# Patient Record
Sex: Female | Born: 1965 | Race: Black or African American | Hispanic: No | Marital: Married | Smoking: Never smoker
Health system: Southern US, Community
[De-identification: ages and names within clinical notes are randomized; demographics above are authoritative.]

## PROBLEM LIST (undated history)

## (undated) DIAGNOSIS — I10 Essential (primary) hypertension: Secondary | ICD-10-CM

## (undated) DIAGNOSIS — H01123 Discoid lupus erythematosus of right eye, unspecified eyelid: Secondary | ICD-10-CM

## (undated) DIAGNOSIS — G629 Polyneuropathy, unspecified: Secondary | ICD-10-CM

## (undated) DIAGNOSIS — C569 Malignant neoplasm of unspecified ovary: Secondary | ICD-10-CM

## (undated) HISTORY — PX: BREAST SURGERY: SHX581

---

## 2008-09-15 ENCOUNTER — Emergency Department (HOSPITAL_BASED_OUTPATIENT_CLINIC_OR_DEPARTMENT_OTHER): Admission: EM | Admit: 2008-09-15 | Discharge: 2008-09-15 | Payer: Self-pay | Admitting: Emergency Medicine

## 2009-01-25 ENCOUNTER — Emergency Department (HOSPITAL_BASED_OUTPATIENT_CLINIC_OR_DEPARTMENT_OTHER): Admission: EM | Admit: 2009-01-25 | Discharge: 2009-01-25 | Payer: Self-pay | Admitting: Emergency Medicine

## 2009-04-16 ENCOUNTER — Emergency Department (HOSPITAL_BASED_OUTPATIENT_CLINIC_OR_DEPARTMENT_OTHER): Admission: EM | Admit: 2009-04-16 | Discharge: 2009-04-16 | Payer: Self-pay | Admitting: Emergency Medicine

## 2010-07-05 LAB — DIFFERENTIAL
Basophils Relative: 2 % — ABNORMAL HIGH (ref 0–1)
Eosinophils Absolute: 0.2 10*3/uL (ref 0.0–0.7)
Lymphs Abs: 2.9 10*3/uL (ref 0.7–4.0)
Monocytes Relative: 6 % (ref 3–12)
Neutro Abs: 4.6 10*3/uL (ref 1.7–7.7)
Neutrophils Relative %: 54 % (ref 43–77)

## 2010-07-05 LAB — URINE CULTURE

## 2010-07-05 LAB — URINALYSIS, ROUTINE W REFLEX MICROSCOPIC
Glucose, UA: >1000 mg/dL — AB
Ketones, ur: NEGATIVE mg/dL
Leukocytes, UA: NEGATIVE
Nitrite: NEGATIVE
Protein, ur: NEGATIVE mg/dL
pH: 6.5 (ref 5.0–8.0)

## 2010-07-05 LAB — GLUCOSE, CAPILLARY
Glucose-Capillary: 304 mg/dL — ABNORMAL HIGH (ref 70–99)
Glucose-Capillary: 440 mg/dL — ABNORMAL HIGH (ref 70–99)

## 2010-07-05 LAB — URINE MICROSCOPIC-ADD ON

## 2010-07-05 LAB — BASIC METABOLIC PANEL
BUN: 12 mg/dL (ref 6–23)
Calcium: 9.2 mg/dL (ref 8.4–10.5)
Creatinine, Ser: 0.7 mg/dL (ref 0.4–1.2)
GFR calc Af Amer: >60 mL/min (ref >60)

## 2010-07-05 LAB — CBC
Platelets: 415 10*3/uL — ABNORMAL HIGH (ref 150–400)
RBC: 4.32 MIL/uL (ref 3.87–5.11)
WBC: 8.3 10*3/uL (ref 4.0–10.5)

## 2010-07-05 LAB — PREGNANCY, URINE: Preg Test, Ur: NEGATIVE

## 2010-07-09 LAB — URINALYSIS, ROUTINE W REFLEX MICROSCOPIC
Hgb urine dipstick: NEGATIVE
Nitrite: NEGATIVE
Protein, ur: NEGATIVE mg/dL
Urobilinogen, UA: 0.2 mg/dL (ref 0.0–1.0)

## 2010-07-09 LAB — DIFFERENTIAL
Basophils Absolute: 0.1 10*3/uL (ref 0.0–0.1)
Basophils Relative: 2 % — ABNORMAL HIGH (ref 0–1)
Eosinophils Absolute: 0.2 10*3/uL (ref 0.0–0.7)
Monocytes Relative: 4 % (ref 3–12)
Neutro Abs: 6.3 10*3/uL (ref 1.7–7.7)
Neutrophils Relative %: 71 % (ref 43–77)

## 2010-07-09 LAB — BASIC METABOLIC PANEL
BUN: 12 mg/dL (ref 6–23)
CO2: 28 mEq/L (ref 19–32)
Calcium: 9.5 mg/dL (ref 8.4–10.5)
Chloride: 97 mEq/L (ref 96–112)
Creatinine, Ser: 0.8 mg/dL (ref 0.4–1.2)
Glucose, Bld: 621 mg/dL (ref 70–99)

## 2010-07-09 LAB — GLUCOSE, CAPILLARY: Glucose-Capillary: 254 mg/dL — ABNORMAL HIGH (ref 70–99)

## 2010-07-09 LAB — CBC
MCHC: 31.7 g/dL (ref 30.0–36.0)
MCV: 76.7 fL — ABNORMAL LOW (ref 78.0–100.0)
Platelets: 387 10*3/uL (ref 150–400)
RBC: 4.21 MIL/uL (ref 3.87–5.11)
RDW: 15.7 % — ABNORMAL HIGH (ref 11.5–15.5)

## 2010-07-09 LAB — URINE MICROSCOPIC-ADD ON

## 2011-02-26 DIAGNOSIS — E119 Type 2 diabetes mellitus without complications: Secondary | ICD-10-CM

## 2011-07-12 ENCOUNTER — Emergency Department (HOSPITAL_BASED_OUTPATIENT_CLINIC_OR_DEPARTMENT_OTHER)
Admission: EM | Admit: 2011-07-12 | Discharge: 2011-07-13 | Disposition: A | Discharge status: Home / Self Care | Payer: Self-pay | Attending: Emergency Medicine | Admitting: Emergency Medicine

## 2011-07-12 ENCOUNTER — Encounter (HOSPITAL_BASED_OUTPATIENT_CLINIC_OR_DEPARTMENT_OTHER): Payer: Self-pay | Admitting: *Deleted

## 2011-07-12 ENCOUNTER — Emergency Department (INDEPENDENT_AMBULATORY_CARE_PROVIDER_SITE_OTHER): Payer: Self-pay

## 2011-07-12 DIAGNOSIS — R220 Localized swelling, mass and lump, head: Secondary | ICD-10-CM

## 2011-07-12 DIAGNOSIS — R51 Headache: Secondary | ICD-10-CM | POA: Insufficient documentation

## 2011-07-12 DIAGNOSIS — L0201 Cutaneous abscess of face: Secondary | ICD-10-CM

## 2011-07-12 DIAGNOSIS — L03211 Cellulitis of face: Secondary | ICD-10-CM | POA: Insufficient documentation

## 2011-07-12 DIAGNOSIS — R739 Hyperglycemia, unspecified: Secondary | ICD-10-CM

## 2011-07-12 DIAGNOSIS — E1169 Type 2 diabetes mellitus with other specified complication: Secondary | ICD-10-CM | POA: Insufficient documentation

## 2011-07-12 DIAGNOSIS — Z79899 Other long term (current) drug therapy: Secondary | ICD-10-CM | POA: Insufficient documentation

## 2011-07-12 DIAGNOSIS — K029 Dental caries, unspecified: Secondary | ICD-10-CM

## 2011-07-12 DIAGNOSIS — K047 Periapical abscess without sinus: Secondary | ICD-10-CM | POA: Insufficient documentation

## 2011-07-12 LAB — CBC
Hemoglobin: 9.9 g/dL — ABNORMAL LOW (ref 12.0–15.0)
MCHC: 33 g/dL (ref 30.0–36.0)
RDW: 14.3 % (ref 11.5–15.5)
WBC: 9.5 10*3/uL (ref 4.0–10.5)

## 2011-07-12 LAB — BASIC METABOLIC PANEL
Chloride: 100 mEq/L (ref 96–112)
GFR calc Af Amer: >90 mL/min (ref >90)
Potassium: 3.4 mEq/L — ABNORMAL LOW (ref 3.5–5.1)

## 2011-07-12 LAB — DIFFERENTIAL
Basophils Absolute: 0 10*3/uL (ref 0.0–0.1)
Basophils Relative: 0 % (ref 0–1)
Neutro Abs: 6.5 10*3/uL (ref 1.7–7.7)
Neutrophils Relative %: 69 % (ref 43–77)

## 2011-07-12 MED ORDER — LIDOCAINE HCL (PF) 1 % IJ SOLN
5.0000 mL | Freq: Once | INTRAMUSCULAR | Status: AC
  Administered 2011-07-12: 5 mL via INTRADERMAL
  Filled 2011-07-12: qty 5

## 2011-07-12 MED ORDER — MORPHINE SULFATE 4 MG/ML IJ SOLN
INTRAMUSCULAR | Status: AC
  Administered 2011-07-12: 4 mg via INTRAVENOUS
  Filled 2011-07-12: qty 1

## 2011-07-12 MED ORDER — MORPHINE SULFATE 4 MG/ML IJ SOLN
4.0000 mg | Freq: Once | INTRAMUSCULAR | Status: AC
  Administered 2011-07-12: 4 mg via INTRAVENOUS

## 2011-07-12 MED ORDER — ONDANSETRON HCL 4 MG/2ML IJ SOLN
4.0000 mg | Freq: Once | INTRAMUSCULAR | Status: AC
  Administered 2011-07-12: 4 mg via INTRAVENOUS
  Filled 2011-07-12: qty 2

## 2011-07-12 MED ORDER — POTASSIUM CHLORIDE 20 MEQ/15ML (10%) PO LIQD
20.0000 meq | Freq: Once | ORAL | Status: AC
  Administered 2011-07-13: 20 meq via ORAL
  Filled 2011-07-12: qty 15

## 2011-07-12 MED ORDER — SULFAMETHOXAZOLE-TRIMETHOPRIM 800-160 MG PO TABS: 2.0000 | ORAL_TABLET | Freq: Two times a day (BID) | ORAL | Status: AC

## 2011-07-12 MED ORDER — IOHEXOL 350 MG/ML SOLN
80.0000 mL | Freq: Once | INTRAVENOUS | Status: AC | PRN
  Administered 2011-07-12: 80 mL via INTRAVENOUS

## 2011-07-12 MED ORDER — PENICILLIN V POTASSIUM 500 MG PO TABS: 500.0000 mg | ORAL_TABLET | Freq: Four times a day (QID) | ORAL | Status: AC

## 2011-07-12 MED ORDER — CLINDAMYCIN PHOSPHATE 900 MG/50ML IV SOLN
900.0000 mg | Freq: Once | INTRAVENOUS | Status: AC
  Administered 2011-07-12: 900 mg via INTRAVENOUS
  Filled 2011-07-12: qty 50

## 2011-07-12 MED ORDER — HYDROCODONE-ACETAMINOPHEN 5-325 MG PO TABS: 2.0000 | ORAL_TABLET | ORAL | Status: AC | PRN

## 2011-07-12 MED ORDER — MORPHINE SULFATE 4 MG/ML IJ SOLN
4.0000 mg | Freq: Once | INTRAMUSCULAR | Status: AC
  Administered 2011-07-12: 4 mg via INTRAVENOUS
  Filled 2011-07-12: qty 1

## 2011-07-12 MED ORDER — SODIUM CHLORIDE 0.9 % IV BOLUS (SEPSIS)
1000.0000 mL | Freq: Once | INTRAVENOUS | Status: AC
  Administered 2011-07-12: 1000 mL via INTRAVENOUS

## 2011-07-12 NOTE — ED Provider Notes (Signed)
History     CSN: [id number]  Arrival date & time 07/12/11  [id number]   First MD Initiated Contact with Patient 07/12/11 1943      Chief Complaint  Patient presents with  . Abscess    (Consider location/radiation/quality/duration/timing/severity/associated sxs/prior treatment) HPI  46yoF h/o IDDM pw Rt facial swelling. Woke up this morning with same. Denies fever/chills. No tooth pain. No skin lesions/mouth lesions prior to this. Denies sore throat, neck pain. C/O 10/10 pain localized to site. Has not been able to eat 2/2 pain. No relief with Aleve.  She did not take her glucose today. Has been compliant with insulin. Dec PO intake today.    ED Notes, ED Provider Notes from 07/12/11 0000 to 07/12/11 19:02:31       Merrie Roof, RN 07/12/2011 19:00      Woke this am with swelling to the right side of her face. No hx of tooth abscess. Jaw is swollen, pain, red, and warm to touch.     Past Medical History  Diagnosis Date  . Diabetes mellitus     History reviewed. No pertinent past surgical history.  No family history on file.  History  Substance Use Topics  . Smoking status: Never Smoker   . Smokeless tobacco: Not on file  . Alcohol Use: No    OB History    Grav Para Term Preterm Abortions TAB SAB Ect Mult Living                  Review of Systems  All other systems reviewed and are negative.  except as noted HPI   Allergies  Review of patient's allergies indicates no known allergies.  Home Medications   Current Outpatient Rx  Name Route Sig Dispense Refill  . IBUPROFEN 800 MG PO TABS Oral Take 800 mg by mouth every 8 (eight) hours as needed. Patient used this medication for cramps.    . INSULIN ASPART PROT & ASPART (70-30) 100 UNIT/ML [place] SUSP Subcutaneous Inject 20 Units into the skin 2 (two) times daily with a meal. Patient uses 20 units in the am, and 20 units in the pm.    . NAPROXEN SODIUM 220 MG PO TABS Oral Take 220 mg by mouth 2 (two) times daily with  a meal. Patient used this medication for menstrual cramps.    Marland Kitchen HYDROCODONE-ACETAMINOPHEN 5-325 MG PO TABS Oral Take 2 tablets by mouth every 4 (four) hours as needed for pain. 6 tablet 0  . PENICILLIN V POTASSIUM 500 MG PO TABS Oral Take 1 tablet (500 mg total) by mouth 4 (four) times daily. 40 tablet 0  . SULFAMETHOXAZOLE-TRIMETHOPRIM 800-160 MG PO TABS Oral Take 2 tablets by mouth every 12 (twelve) hours. 10 tablet 0    BP 149/92  Pulse 82  Temp(Src) 98.3 F (36.8 C) (Oral)  Resp 16  SpO2 100%  LMP 06/15/2011  Physical Exam  Nursing note and vitals reviewed. Constitutional: She is oriented to person, place, and time. She appears well-developed.  HENT:  Head: Atraumatic.    Mouth/Throat: Oropharynx is clear and moist.    Eyes: Conjunctivae and EOM are normal. Pupils are equal, round, and reactive to light.  Neck: Normal range of motion. Neck supple.  Cardiovascular: Normal rate, regular rhythm, normal heart sounds and intact distal pulses.   Pulmonary/Chest: Effort normal and breath sounds normal. No respiratory distress. She has no wheezes. She has no rales.  Abdominal: Soft. She exhibits no distension. There is no tenderness. There  is no rebound and no guarding.  Musculoskeletal: Normal range of motion.  Neurological: She is alert and oriented to person, place, and time.  Skin: Skin is warm and dry. No rash noted.  Psychiatric: She has a normal mood and affect.    ED Course  Procedures (including critical care time)  Labs Reviewed  CBC - Abnormal; Notable for the following:    Hemoglobin 9.9 (*)    HCT 30.0 (*)    MCV 75.6 (*)    MCH 24.9 (*)    Platelets 449 (*)    All other components within normal limits  BASIC METABOLIC PANEL - Abnormal; Notable for the following:    Potassium 3.4 (*)    Glucose, Bld 300 (*)    All other components within normal limits  GLUCOSE, CAPILLARY - Abnormal; Notable for the following:    Glucose-Capillary 295 (*)    All other  components within normal limits  DIFFERENTIAL   Ct Maxillofacial W/cm  07/12/2011  *RADIOLOGY REPORT*  Clinical Data: Right-sided facial cellulitis and pain.  Evaluate for abscess.  CT MAXILLOFACIAL WITH CONTRAST  Technique:  Multidetector CT imaging of the maxillofacial structures was performed with intravenous contrast. Multiplanar CT image reconstructions were also generated.  Contrast: 65mL OMNIPAQUE IOHEXOL 350 MG/ML SOLN  Comparison: None.  Findings: Asymmetric soft tissue swelling is seen in the soft tissues adjacent to the right mandible, and in the more superficial subcutaneous tissues adjacent to the right masseter muscle.  This is consistent with cellulitis. No drainable abscess identified.  Periapical lucency is seen adjacent to the roots of the right mandibular molars, consistent with peri apical abscesses.  Large caries are seen involving these molars.  There is no evidence of facial bone fracture.  No evidence of sinus air fluid levels or orbital emphysema.  Globes and intraorbital anatomy are normal in appearance.  IMPRESSION:  1.  Asymmetric right mandibular soft tissue swelling, consistent with cellulitis.  No soft tissue abscess identified. 2.  Large caries involving the right mandibular molars, with periapical lucencies consistent with periapical/odontogenic abscesses.  Original Report Authenticated By: Marlaine Hind, M.D.    1. Dental abscess     MDM  Dental abscess with min facial cellulitis. Small flesh colored lesion gums nearby (pt states chronic), local I&D without pus obtained. Given clindamycin in the emergency department. Self pay and no [facility] nearby for clindamycin at lower price. Will give prescription for norco, bactrim, PCN VK. Dental referrals. Strict precautions for return.   pmd- Keturah Barre- [facility]    Blair Heys, MD 07/12/11 [phone]

## 2011-07-12 NOTE — ED Notes (Signed)
Woke this am with swelling to the right side of her face. No hx of tooth abscess. Jaw is swollen, pain, red, and warm to touch.

## 2011-07-12 NOTE — Discharge Instructions (Signed)
Abscessed Tooth A tooth abscess is a collection of infected fluid (pus) from a bacterial infection in the inner part of the tooth (pulp). It usually occurs at the end of the tooth's root.  CAUSES   A very bad cavity (extensive tooth decay).   Trauma to the tooth, such as a broken or chipped tooth, that allows bacteria to enter into the pulp.  SYMPTOMS  Severe pain in and around the infected tooth.   Swelling and redness around the abscessed tooth or in the mouth or face.   Tenderness.   Pus drainage.   Bad breath.   Bitter taste in the mouth.   Difficulty swallowing.   Difficulty opening the mouth.   Feeling sick to your stomach (nauseous).   Vomiting.   Chills.   Swollen neck glands.  DIAGNOSIS  A medical and dental history will be taken.   An examination will be performed by tapping on the abscessed tooth.   X-rays may be taken of the tooth to identify the abscess.  TREATMENT The goal of treatment is to eliminate the infection.   You may be prescribed antibiotic medicine to stop the infection from spreading.   A root canal may be performed to save the tooth. If the tooth cannot be saved, it may be pulled (extracted) and the abscess may be drained.  HOME CARE INSTRUCTIONS  Only take over-the-counter or prescription medicines for pain, fever, or discomfort as directed by your caregiver.   Do not drive after taking pain medicine (narcotics).   Rinse your mouth (gargle) often with salt water ( tsp salt in 8 oz of warm water) to relieve pain or swelling.   Do not apply heat to the outside of your face.   Return to your dentist for further treatment as directed.  SEEK IMMEDIATE DENTAL CARE IF:  You have a temperature by mouth above 102 F (38.9 C), not controlled by medicine.   You have chills or a very bad headache.   You have problems breathing or swallowing.   Your have trouble opening your mouth.   You develop swelling in the neck or around the eye.    Your pain is not helped by medicine.   Your pain is getting worse instead of better.  Document Released: 03/18/2005 Document Revised: 03/07/2011 Document Reviewed: 06/26/2010 [facility] Patient Information 2012 [place], [place].  RESOURCE GUIDE  Dental Problems  Patients with Medicaid: [place] [place] [employer] Phone:  [phone]                                                   Phone:  [phone]  If unable to pay or uninsured, contact:  [facility] or [facility]. to become qualified for the adult dental clinic.  Chronic Pain Problems Contact [facility]  [phone] Patients need to be referred by their primary care doctor.  Insufficient Money for Medicine Contact [employer]:  call "211" or [place] [phone].  No Primary Care Doctor Call [facility]  [phone] Other agencies that provide inexpensive medical care    [place]  [phone]    [facility]  [place]  [phone]    [facility]  [phone]    [facility]  [place]  [phone] [facility]  [phone] [place]   [phone] (emergency services [phone])  Abuse/Neglect [place] [phone] [place] [phone] (After Hours)  Emergency [place] [phone]  [place] at the [place] [phone] [place] [phone]  MRSA Hotline #:   ([phone]    [facility] Dept. [address]          [place] Phone:  [phone]                                  Phone:  [phone]                   Phone:  [place] Phone:  [place] [phone] (After Hours)  Metformin and X-ray Contrast Studies For some X-ray exams, a contrast dye is used. Contrast dye is a type of medicine used to make the X-ray image clearer. The contrast dye is given to the patient through a vein (intravenously). If you need to have this type of X-ray exam and you take a medication called metformin, your caregiver may have you stop taking metformin before the exam.  LACTIC ACIDOSIS In rare cases, a serious medical condition called lactic acidosis can develop in people who take metformin and receive contrast dye. The following conditions can increase the risk of this complication:   Kidney failure.   Liver problems.   Certain types of heart problems such as:   Heart failure.   Heart attack.   Heart infection.   Heart valve problems.   Alcohol abuse.  If left untreated, lactic acidosis can lead to coma.  SYMPTOMS OF LACTIC ACIDOSIS Symptoms of lactic acidosis can include:  Rapid breathing (hyperventilation).   Neurologic symptoms such as:   Headaches.   Confusion.   Dizziness.   Excessive sweating.   Feeling sick to your stomach (nauseous) or throwing up (vomiting).  AFTER THE X-RAY EXAM  Stay well-hydrated. Drink fluids as instructed by  your caregiver.   If you have a risk of developing lactic acidosis, blood tests may be done to make sure your kidney function is okay.   Metformin is usually stopped for 48 hours after the X-ray exam. Ask your caregiver when you can start taking metformin again.  SEEK MEDICAL CARE IF:   You have shortness of breath or difficulty breathing.   You develop  a headache that does not go away.   You have nausea or vomiting.   You urinate more than normal.   You develop a skin rash and have:   Redness.   Swelling.   Itching.  Document Released: 03/06/2009 Document Revised: 03/07/2011 Document Reviewed: 03/06/2009 [facility] Patient Information 2012 [place].

## 2013-06-16 DIAGNOSIS — E785 Hyperlipidemia, unspecified: Secondary | ICD-10-CM | POA: Diagnosis present

## 2013-11-17 ENCOUNTER — Emergency Department (HOSPITAL_BASED_OUTPATIENT_CLINIC_OR_DEPARTMENT_OTHER)
Admission: EM | Admit: 2013-11-17 | Discharge: 2013-11-17 | Disposition: A | Discharge status: Home / Self Care | Payer: BC Managed Care – PPO | Attending: Emergency Medicine | Admitting: Emergency Medicine

## 2013-11-17 ENCOUNTER — Encounter (HOSPITAL_BASED_OUTPATIENT_CLINIC_OR_DEPARTMENT_OTHER): Payer: Self-pay | Admitting: Emergency Medicine

## 2013-11-17 DIAGNOSIS — E860 Dehydration: Secondary | ICD-10-CM | POA: Insufficient documentation

## 2013-11-17 DIAGNOSIS — R110 Nausea: Secondary | ICD-10-CM | POA: Diagnosis not present

## 2013-11-17 DIAGNOSIS — Z3202 Encounter for pregnancy test, result negative: Secondary | ICD-10-CM | POA: Insufficient documentation

## 2013-11-17 DIAGNOSIS — R1084 Generalized abdominal pain: Secondary | ICD-10-CM | POA: Diagnosis present

## 2013-11-17 DIAGNOSIS — E119 Type 2 diabetes mellitus without complications: Secondary | ICD-10-CM | POA: Insufficient documentation

## 2013-11-17 DIAGNOSIS — R000 Tachycardia, unspecified: Secondary | ICD-10-CM | POA: Insufficient documentation

## 2013-11-17 DIAGNOSIS — R630 Anorexia: Secondary | ICD-10-CM | POA: Insufficient documentation

## 2013-11-17 DIAGNOSIS — Z79899 Other long term (current) drug therapy: Secondary | ICD-10-CM | POA: Insufficient documentation

## 2013-11-17 DIAGNOSIS — Z794 Long term (current) use of insulin: Secondary | ICD-10-CM | POA: Insufficient documentation

## 2013-11-17 DIAGNOSIS — R739 Hyperglycemia, unspecified: Secondary | ICD-10-CM

## 2013-11-17 LAB — URINALYSIS, ROUTINE W REFLEX MICROSCOPIC
Bilirubin Urine: NEGATIVE
Glucose, UA: >1000 mg/dL — AB
Hgb urine dipstick: NEGATIVE
Ketones, ur: 15 mg/dL — AB
Leukocytes, UA: NEGATIVE
NITRITE: NEGATIVE
Protein, ur: NEGATIVE mg/dL
SPECIFIC GRAVITY, URINE: 1.043 — AB (ref 1.005–1.030)
UROBILINOGEN UA: 0.2 mg/dL (ref 0.0–1.0)
pH: 5 (ref 5.0–8.0)

## 2013-11-17 LAB — URINE MICROSCOPIC-ADD ON

## 2013-11-17 LAB — CBC WITH DIFFERENTIAL/PLATELET
Basophils Absolute: 0 10*3/uL (ref 0.0–0.1)
Basophils Relative: 1 % (ref 0–1)
EOS PCT: 1 % (ref 0–5)
Eosinophils Absolute: 0.1 10*3/uL (ref 0.0–0.7)
HEMATOCRIT: 37.6 % (ref 36.0–46.0)
Hemoglobin: 12.7 g/dL (ref 12.0–15.0)
LYMPHS ABS: 2.3 10*3/uL (ref 0.7–4.0)
LYMPHS PCT: 28 % (ref 12–46)
MCH: 26.8 pg (ref 26.0–34.0)
MCHC: 33.8 g/dL (ref 30.0–36.0)
MCV: 79.3 fL (ref 78.0–100.0)
MONO ABS: 0.5 10*3/uL (ref 0.1–1.0)
Monocytes Relative: 6 % (ref 3–12)
NEUTROS ABS: 5.4 10*3/uL (ref 1.7–7.7)
Neutrophils Relative %: 64 % (ref 43–77)
PLATELETS: 365 10*3/uL (ref 150–400)
RBC: 4.74 MIL/uL (ref 3.87–5.11)
RDW: 12.5 % (ref 11.5–15.5)
WBC: 8.4 10*3/uL (ref 4.0–10.5)

## 2013-11-17 LAB — I-STAT VENOUS BLOOD GAS, ED
ACID-BASE EXCESS: 4 mmol/L — AB (ref 0.0–2.0)
BICARBONATE: 29.1 meq/L — AB (ref 20.0–24.0)
O2 SAT: 68 %
PH VEN: 7.408 — AB (ref 7.250–7.300)
TCO2: 30 mmol/L (ref 0–100)
pCO2, Ven: 46.1 mmHg (ref 45.0–50.0)
pO2, Ven: 36 mmHg (ref 30.0–45.0)

## 2013-11-17 LAB — COMPREHENSIVE METABOLIC PANEL
ALT: 13 U/L (ref 0–35)
AST: 13 U/L (ref 0–37)
Albumin: 3.6 g/dL (ref 3.5–5.2)
Alkaline Phosphatase: 115 U/L (ref 39–117)
Anion gap: 15 (ref 5–15)
BILIRUBIN TOTAL: 0.5 mg/dL (ref 0.3–1.2)
BUN: 18 mg/dL (ref 6–23)
CALCIUM: 10.4 mg/dL (ref 8.4–10.5)
CHLORIDE: 90 meq/L — AB (ref 96–112)
CO2: 27 meq/L (ref 19–32)
Creatinine, Ser: 0.7 mg/dL (ref 0.50–1.10)
GLUCOSE: 535 mg/dL — AB (ref 70–99)
Potassium: 4.4 mEq/L (ref 3.7–5.3)
SODIUM: 132 meq/L — AB (ref 137–147)
Total Protein: 8.5 g/dL — ABNORMAL HIGH (ref 6.0–8.3)

## 2013-11-17 LAB — CBG MONITORING, ED
GLUCOSE-CAPILLARY: 487 mg/dL — AB (ref 70–99)
GLUCOSE-CAPILLARY: 310 mg/dL — AB (ref 70–99)
Glucose-Capillary: 330 mg/dL — ABNORMAL HIGH (ref 70–99)

## 2013-11-17 LAB — LIPASE, BLOOD: Lipase: 50 U/L (ref 11–59)

## 2013-11-17 LAB — PREGNANCY, URINE: PREG TEST UR: NEGATIVE

## 2013-11-17 MED ORDER — SODIUM CHLORIDE 0.9 % IV BOLUS (SEPSIS)
1000.0000 mL | Freq: Once | INTRAVENOUS | Status: AC
  Administered 2013-11-17: 1000 mL via INTRAVENOUS

## 2013-11-17 MED ORDER — INSULIN REGULAR HUMAN 100 UNIT/ML IJ SOLN
5.0000 [IU] | Freq: Once | INTRAMUSCULAR | Status: AC
  Administered 2013-11-17: 5 [IU] via SUBCUTANEOUS
  Filled 2013-11-17: qty 1

## 2013-11-17 MED ORDER — INSULIN REGULAR HUMAN 100 UNIT/ML IJ SOLN
10.0000 [IU] | Freq: Once | INTRAMUSCULAR | Status: AC
  Administered 2013-11-17: 10 [IU] via INTRAVENOUS
  Filled 2013-11-17: qty 1

## 2013-11-17 MED ORDER — SODIUM CHLORIDE 0.9 % IV BOLUS (SEPSIS)
500.0000 mL | Freq: Once | INTRAVENOUS | Status: AC
  Administered 2013-11-17: 500 mL via INTRAVENOUS

## 2013-11-17 MED ORDER — INSULIN GLARGINE 100 UNIT/ML ~~LOC~~ SOLN
25.0000 [IU] | Freq: Once | SUBCUTANEOUS | Status: AC
  Administered 2013-11-17: 25 [IU] via SUBCUTANEOUS

## 2013-11-17 MED ORDER — INSULIN GLARGINE 100 UNIT/ML ~~LOC~~ SOLN: 25.0000 [IU] | Freq: Every day | SUBCUTANEOUS | Status: AC

## 2013-11-17 MED ORDER — INSULIN GLARGINE 100 UNIT/ML ~~LOC~~ SOLN
SUBCUTANEOUS | Status: AC
  Filled 2013-11-17: qty 2

## 2013-11-17 MED ORDER — GLUCOSE BLOOD VI STRP: ORAL_STRIP | Status: AC

## 2013-11-17 NOTE — Discharge Instructions (Signed)
Blood Glucose Monitoring °Monitoring your blood glucose (also know as blood sugar) helps you to manage your diabetes. It also helps you and your health care provider monitor your diabetes and determine how well your treatment plan is working. °WHY SHOULD YOU MONITOR YOUR BLOOD GLUCOSE? °· It can help you understand how food, exercise, and medicine affect your blood glucose. °· It allows you to know what your blood glucose is at any given moment. You can quickly tell if you are having low blood glucose (hypoglycemia) or high blood glucose (hyperglycemia). °· It can help you and your health care provider know how to adjust your medicines. °· It can help you understand how to manage an illness or adjust medicine for exercise. °WHEN SHOULD YOU TEST? °Your health care provider will help you decide how often you should check your blood glucose. This may depend on the type of diabetes you have, your diabetes control, or the types of medicines you are taking. Be sure to write down all of your blood glucose readings so that this information can be reviewed with your health care provider. See below for examples of testing times that your health care provider may suggest. °Type 1 Diabetes °· Test 4 times a day if you are in good control, using an insulin pump, or perform multiple daily injections. °· If your diabetes is not well controlled or if you are sick, you may need to monitor more often. °· It is a good idea to also monitor: °· Before and after exercise. °· Between meals and 2 hours after a meal. °· Occasionally between 2:00 a.m. and 3:00 a.m. °Type 2 Diabetes °· It can vary with each person, but generally, if you are on insulin, test 4 times a day. °· If you take medicines by mouth (orally), test 2 times a day. °· If you are on a controlled diet, test once a day. °· If your diabetes is not well controlled or if you are sick, you may need to monitor more often. °HOW TO MONITOR YOUR BLOOD GLUCOSE °Supplies  Needed °· Blood glucose meter. °· Test strips for your meter. Each meter has its own strips. You must use the strips that go with your own meter. °· A pricking needle (lancet). °· A device that holds the lancet (lancing device). °· A journal or log book to write down your results. °Procedure °· Wash your hands with soap and water. Alcohol is not preferred. °· Prick the side of your finger (not the tip) with the lancet. °· Gently milk the finger until a small drop of blood appears. °· Follow the instructions that come with your meter for inserting the test strip, applying blood to the strip, and using your blood glucose meter. °Other Areas to Get Blood for Testing °Some meters allow you to use other areas of your body (other than your finger) to test your blood. These areas are called alternative sites. The most common alternative sites are: °· The forearm. °· The thigh. °· The back area of the lower leg. °· The palm of the hand. °The blood flow in these areas is slower. Therefore, the blood glucose values you get may be delayed, and the numbers are different from what you would get from your fingers. Do not use alternative sites if you think you are having hypoglycemia. Your reading will not be accurate. Always use a finger if you are having hypoglycemia. Also, if you cannot feel your lows (hypoglycemia unawareness), always use your fingers for your   blood glucose checks. °ADDITIONAL TIPS FOR GLUCOSE MONITORING °· Do not reuse lancets. °· Always carry your supplies with you. °· All blood glucose meters have a 24-hour "hotline" number to call if you have questions or need help. °· Adjust (calibrate) your blood glucose meter with a control solution after finishing a few boxes of strips. °BLOOD GLUCOSE RECORD KEEPING °It is a good idea to keep a daily record or log of your blood glucose readings. Most glucose meters, if not all, keep your glucose records stored in the meter. Some meters come with the ability to download  your records to your home computer. Keeping a record of your blood glucose readings is especially helpful if you are wanting to look for patterns. Make notes to go along with the blood glucose readings because you might forget what happened at that exact time. Keeping good records helps you and your health care provider to work together to achieve good diabetes management.  °Document Released: 03/21/2003 Document Revised: 08/02/2013 Document Reviewed: 08/10/2012 °ExitCare® Patient Information ©2015 [employer]. This information is not intended to replace advice given to you by your health care provider. Make sure you discuss any questions you have with your health care provider. °Hyperglycemia °Hyperglycemia occurs when the glucose (sugar) in your blood is too high. Hyperglycemia can happen for many reasons, but it most often happens to people who do not know they have diabetes or are not managing their diabetes properly.  °CAUSES  °Whether you have diabetes or not, there are other causes of hyperglycemia. Hyperglycemia can occur when you have diabetes, but it can also occur in other situations that you might not be as aware of, such as: °Diabetes °· If you have diabetes and are having problems controlling your blood glucose, hyperglycemia could occur because of some of the following reasons: °¨ Not following your meal plan. °¨ Not taking your diabetes medications or not taking it properly. °¨ Exercising less or doing less activity than you normally do. °¨ Being sick. °Pre-diabetes °· This cannot be ignored. Before people develop Type 2 diabetes, they almost always have "pre-diabetes." This is when your blood glucose levels are higher than normal, but not yet high enough to be diagnosed as diabetes. Research has shown that some long-term damage to the body, especially the heart and circulatory system, may already be occurring during pre-diabetes. If you take action to manage your blood glucose when you have  pre-diabetes, you may delay or prevent Type 2 diabetes from developing. °Stress °· If you have diabetes, you may be "diet" controlled or on oral medications or insulin to control your diabetes. However, you may find that your blood glucose is higher than usual in the hospital whether you have diabetes or not. This is often referred to as "stress hyperglycemia." Stress can elevate your blood glucose. This happens because of hormones put out by the body during times of stress. If stress has been the cause of your high blood glucose, it can be followed regularly by your caregiver. That way he/she can make sure your hyperglycemia does not continue to get worse or progress to diabetes. °Steroids °· Steroids are medications that act on the infection fighting system (immune system) to block inflammation or infection. One side effect can be a rise in blood glucose. Most people can produce enough extra insulin to allow for this rise, but for those who cannot, steroids make blood glucose levels go even higher. It is not unusual for steroid treatments to "uncover" diabetes that   is developing. It is not always possible to determine if the hyperglycemia will go away after the steroids are stopped. A special blood test called an A1c is sometimes done to determine if your blood glucose was elevated before the steroids were started. °SYMPTOMS °· Thirsty. °· Frequent urination. °· Dry mouth. °· Blurred vision. °· Tired or fatigue. °· Weakness. °· Sleepy. °· Tingling in feet or leg. °DIAGNOSIS  °Diagnosis is made by monitoring blood glucose in one or all of the following ways: °· A1c test. This is a chemical found in your blood. °· Fingerstick blood glucose monitoring. °· Laboratory results. °TREATMENT  °First, knowing the cause of the hyperglycemia is important before the hyperglycemia can be treated. Treatment may include, but is not be limited to: °· Education. °· Change or adjustment in medications. °· Change or adjustment in  meal plan. °· Treatment for an illness, infection, etc. °· More frequent blood glucose monitoring. °· Change in exercise plan. °· Decreasing or stopping steroids. °· Lifestyle changes. °HOME CARE INSTRUCTIONS  °· Test your blood glucose as directed. °· Exercise regularly. Your caregiver will give you instructions about exercise. Pre-diabetes or diabetes which comes on with stress is helped by exercising. °· Eat wholesome, balanced meals. Eat often and at regular, fixed times. Your caregiver or nutritionist will give you a meal plan to guide your sugar intake. °· Being at an ideal weight is important. If needed, losing as little as 10 to 15 pounds may help improve blood glucose levels. °SEEK MEDICAL CARE IF:  °· You have questions about medicine, activity, or diet. °· You continue to have symptoms (problems such as increased thirst, urination, or weight gain). °SEEK IMMEDIATE MEDICAL CARE IF:  °· You are vomiting or have diarrhea. °· Your breath smells fruity. °· You are breathing faster or slower. °· You are very sleepy or incoherent. °· You have numbness, tingling, or pain in your feet or hands. °· You have chest pain. °· Your symptoms get worse even though you have been following your caregiver's orders. °· If you have any other questions or concerns. °Document Released: 09/11/2000 Document Revised: 06/10/2011 Document Reviewed: 07/15/2011 °ExitCare® Patient Information ©2015 [employer]. This information is not intended to replace advice given to you by your health care provider. Make sure you discuss any questions you have with your health care provider. ° °

## 2013-11-17 NOTE — ED Notes (Signed)
NS 500 order clarified with EDP-pt with NS 2000cc infused-orders to give and to give pt snack-pt given graham crackers and diet gingerale

## 2013-11-17 NOTE — ED Provider Notes (Signed)
CSN: [id number]     Arrival date & time 11/17/13  1640 History   First MD Initiated Contact with Patient 11/17/13 1720     Chief Complaint  Patient presents with  . Abdominal Pain     (Consider location/radiation/quality/duration/timing/severity/associated sxs/prior Treatment) HPI Comments: Pt out of her lantus for 2 weeks  Patient is a 48 y.o. female presenting with abdominal pain. The history is provided by the patient and a relative. No language interpreter was used.  Abdominal Pain Pain location:  Generalized Pain quality: aching   Pain radiates to:  Does not radiate Pain severity:  No pain Onset quality:  Sudden Progression:  Resolved Chronicity:  New Context comment:  After eating "bad" sandwich from cookout 1 week ago Relieved by:  Nothing Worsened by:  Nothing tried Ineffective treatments:  None tried Associated symptoms: anorexia, chills, fatigue and nausea   Associated symptoms: no chest pain, no cough, no diarrhea, no dysuria, no fever, no shortness of breath, no sore throat and no vomiting   Associated symptoms comment:  Polyuria, polydipsia  Risk factors: obesity   Risk factors comment:  DM   Past Medical History  Diagnosis Date  . Diabetes mellitus    History reviewed. No pertinent past surgical history. No family history on file. History  Substance Use Topics  . Smoking status: Never Smoker   . Smokeless tobacco: Not on file  . Alcohol Use: No   OB History   Grav Para Term Preterm Abortions TAB SAB Ect Mult Living                 Review of Systems  Constitutional: Positive for chills and fatigue. Negative for fever, diaphoresis, activity change and appetite change.  HENT: Negative for congestion, facial swelling, rhinorrhea and sore throat.   Eyes: Negative for photophobia and discharge.  Respiratory: Negative for cough, chest tightness and shortness of breath.   Cardiovascular: Negative for chest pain, palpitations and leg swelling.   Gastrointestinal: Positive for nausea, abdominal pain and anorexia. Negative for vomiting and diarrhea.  Endocrine: Negative for polydipsia and polyuria.  Genitourinary: Negative for dysuria, frequency, difficulty urinating and pelvic pain.  Musculoskeletal: Negative for arthralgias, back pain, neck pain and neck stiffness.  Skin: Negative for color change and wound.  Allergic/Immunologic: Negative for immunocompromised state.  Neurological: Negative for facial asymmetry, weakness, numbness and headaches.  Hematological: Does not bruise/bleed easily.  Psychiatric/Behavioral: Negative for confusion and agitation.      Allergies  Review of patient's allergies indicates no known allergies.  Home Medications   Prior to Admission medications   Medication Sig Start Date End Date Taking? Authorizing Provider  lisinopril (PRINIVIL,ZESTRIL) 10 MG tablet Take 10 mg by mouth daily.   Yes Historical Provider, MD  lovastatin (MEVACOR) 10 MG tablet Take 10 mg by mouth at bedtime.   Yes Historical Provider, MD  meclizine (ANTIVERT) 12.5 MG tablet Take 12.5 mg by mouth 3 (three) times daily as needed for dizziness.   Yes Historical Provider, MD  glucose blood test strip Use as instructed 11/17/13   Ernestina Patches, MD  ibuprofen (ADVIL,MOTRIN) 800 MG tablet Take 800 mg by mouth every 8 (eight) hours as needed. Patient used this medication for cramps.    Historical Provider, MD  insulin aspart protamine-insulin aspart (NOVOLOG 70/30) (70-30) 100 UNIT/ML injection Inject 20 Units into the skin 2 (two) times daily with a meal. Patient uses 20 units in the am, and 20 units in the pm.    Historical Provider,  MD  insulin glargine (LANTUS) 100 UNIT/ML injection Inject 0.25 mLs (25 Units total) into the skin at bedtime. 11/17/13   Ernestina Patches, MD  naproxen sodium (ANAPROX) 220 MG tablet Take 220 mg by mouth 2 (two) times daily with a meal. Patient used this medication for menstrual cramps.    Historical  Provider, MD   BP 140/65  Pulse 102  Temp(Src) 98.6 F (37 C) (Oral)  Resp 16  Ht 5\' 8"  (1.727 m)  Wt 254 lb (115.214 kg)  BMI 38.63 kg/m2  SpO2 96%  LMP 10/15/2013 Physical Exam  Constitutional: She is oriented to person, place, and time. She appears well-developed and well-nourished. No distress.  HENT:  Head: Normocephalic and atraumatic.  Mouth/Throat: No oropharyngeal exudate.  Eyes: Pupils are equal, round, and reactive to light.  Neck: Normal range of motion. Neck supple.  Cardiovascular: Regular rhythm and normal heart sounds.  Tachycardia present.  Exam reveals no gallop and no friction rub.   No murmur heard. Pulmonary/Chest: Effort normal and breath sounds normal. No respiratory distress. She has no wheezes. She has no rales.  Abdominal: Soft. Bowel sounds are normal. She exhibits no distension and no mass. There is no tenderness. There is no rebound and no guarding.  Musculoskeletal: Normal range of motion. She exhibits no edema and no tenderness.  Neurological: She is alert and oriented to person, place, and time.  Skin: Skin is warm and dry.  Psychiatric: She has a normal mood and affect.    ED Course  Procedures (including critical care time) Labs Review Labs Reviewed  URINALYSIS, ROUTINE W REFLEX MICROSCOPIC - Abnormal; Notable for the following:    Specific Gravity, Urine 1.043 (*)    Glucose, UA >1000 (*)    Ketones, ur 15 (*)    All other components within normal limits  COMPREHENSIVE METABOLIC PANEL - Abnormal; Notable for the following:    Sodium 132 (*)    Chloride 90 (*)    Glucose, Bld 535 (*)    Total Protein 8.5 (*)    All other components within normal limits  CBG MONITORING, ED - Abnormal; Notable for the following:    Glucose-Capillary 487 (*)    All other components within normal limits  I-STAT VENOUS BLOOD GAS, ED - Abnormal; Notable for the following:    pH, Ven 7.408 (*)    Bicarbonate 29.1 (*)    Acid-Base Excess 4.0 (*)    All  other components within normal limits  CBG MONITORING, ED - Abnormal; Notable for the following:    Glucose-Capillary 330 (*)    All other components within normal limits  CBG MONITORING, ED - Abnormal; Notable for the following:    Glucose-Capillary 310 (*)    All other components within normal limits  PREGNANCY, URINE  URINE MICROSCOPIC-ADD ON  CBC WITH DIFFERENTIAL  LIPASE, BLOOD    Imaging Review No results found.   EKG Interpretation None      MDM   Final diagnoses:  Hyperglycemia  Dehydration    Pt is a 48 y.o. female with Pmhx as above who presents reporting she has been feeling unwell for about 1 week initially after eating what she thinks was a bad sandwich from cookout. She reports generalized weakness, fatigue, nausea, polyuria, polydipsia. Her ab pain as resolved, as has her initial d/a. She has been out of her nightly Lantus for about 2 week. POC glu today 487. She has not checked her sugar in several days. On PE, she is  mildly tachycardic, VS otherwise stable. Cardiopulm and abdominal exam benign. She has dry mucus membranes.   Lab glu 535, AG 15, CO2 nml at 27, pH 7.4. Pt feeling much improved after 2.5L NS and a total of 15U IV insulin with improvement of glu to 310.  Will d/c home with refill of her 25U nightly lantus and rx for new home testing device. Will rec she f/u with PCP.  Return precautions given for new or worsening symptoms including continued high glu, worsening abdominal pain, confusion, inability to tolerate PO.          Ernestina Patches, MD 11/17/13 [phone]

## 2013-11-17 NOTE — ED Notes (Signed)
Reports generalized weakness since last Tuesday. Ate "bad food" from [employer]. Reports decreased energy.

## 2014-09-12 DIAGNOSIS — G5712 Meralgia paresthetica, left lower limb: Secondary | ICD-10-CM | POA: Insufficient documentation

## 2015-06-13 ENCOUNTER — Emergency Department (HOSPITAL_BASED_OUTPATIENT_CLINIC_OR_DEPARTMENT_OTHER)
Admission: EM | Admit: 2015-06-13 | Discharge: 2015-06-13 | Disposition: A | Discharge status: Home / Self Care | Payer: BLUE CROSS/BLUE SHIELD | Attending: Emergency Medicine | Admitting: Emergency Medicine

## 2015-06-13 ENCOUNTER — Emergency Department (HOSPITAL_BASED_OUTPATIENT_CLINIC_OR_DEPARTMENT_OTHER): Payer: BLUE CROSS/BLUE SHIELD

## 2015-06-13 ENCOUNTER — Encounter (HOSPITAL_BASED_OUTPATIENT_CLINIC_OR_DEPARTMENT_OTHER): Payer: Self-pay | Admitting: Emergency Medicine

## 2015-06-13 DIAGNOSIS — E119 Type 2 diabetes mellitus without complications: Secondary | ICD-10-CM | POA: Insufficient documentation

## 2015-06-13 DIAGNOSIS — Z794 Long term (current) use of insulin: Secondary | ICD-10-CM | POA: Insufficient documentation

## 2015-06-13 DIAGNOSIS — R0781 Pleurodynia: Secondary | ICD-10-CM | POA: Diagnosis present

## 2015-06-13 DIAGNOSIS — M940 Chondrocostal junction syndrome [Tietze]: Secondary | ICD-10-CM | POA: Diagnosis not present

## 2015-06-13 DIAGNOSIS — Z79899 Other long term (current) drug therapy: Secondary | ICD-10-CM | POA: Insufficient documentation

## 2015-06-13 MED ORDER — NAPROXEN 500 MG PO TABS: 500.0000 mg | ORAL_TABLET | Freq: Two times a day (BID) | ORAL | Status: AC

## 2015-06-13 MED ORDER — HYDROCODONE-ACETAMINOPHEN 5-325 MG PO TABS: 2.0000 | ORAL_TABLET | ORAL | Status: DC | PRN

## 2015-06-13 NOTE — ED Notes (Signed)
Pt with left sided pain below breast that radiates to left flank x 3 weeks, pain is reproudicble and pain is worse if she lays on left side, pt saw pcp last thur and he instructed her to change bra's

## 2015-06-13 NOTE — ED Provider Notes (Signed)
CSN: [id number]     Arrival date & time 06/13/15  1809 History  By signing my name below, I, Gabrielle Gill, attest that this documentation has been prepared under the direction and in the presence of Tanna Furry, MD. Electronically Signed: [name], ED Scribe. 06/13/2015. 6:53 PM   Chief Complaint  Patient presents with  . Flank Pain    The history is provided by the patient. No language interpreter was used.   Gabrielle Gill is a 50 y.o. female with PMHx of DM who presents to the Emergency Department complaining of constant, atraumatic, 10/10 in severity, pain under the left breast at the ribs with onset 3 weeks ago. The pain is improved with rest but not exacerbated by movement or breathing.  Pt denies rash,  SOB, and abdominal pain. She also denies ever smoking. No known history of cardiac disease.   Past Medical History  Diagnosis Date  . Diabetes mellitus    History reviewed. No pertinent past surgical history. History reviewed. No pertinent family history. Social History  Substance Use Topics  . Smoking status: Never Smoker   . Smokeless tobacco: None  . Alcohol Use: No   OB History    No data available     Review of Systems  Constitutional: Negative for fever, chills, diaphoresis, appetite change and fatigue.  HENT: Negative for mouth sores, sore throat and trouble swallowing.   Eyes: Negative for visual disturbance.  Respiratory: Negative for cough, chest tightness, shortness of breath and wheezing.   Gastrointestinal: Negative for nausea, vomiting, abdominal pain, diarrhea and abdominal distention.  Endocrine: Negative for polydipsia, polyphagia and polyuria.  Genitourinary: Negative for dysuria, frequency and hematuria.  Musculoskeletal: Negative for gait problem.       Pain at the left ribs  Skin: Negative for color change, pallor and rash.  Neurological: Negative for dizziness, syncope, light-headedness and headaches.  Hematological: Does not bruise/bleed  easily.  Psychiatric/Behavioral: Negative for behavioral problems and confusion.   Allergies  Review of patient's allergies indicates no known allergies.  Home Medications   Prior to Admission medications   Medication Sig Start Date End Date Taking? Authorizing Provider  insulin glargine (LANTUS) 100 UNIT/ML injection Inject 0.25 mLs (25 Units total) into the skin at bedtime. 11/17/13  Yes Ernestina Patches, MD  lisinopril (PRINIVIL,ZESTRIL) 10 MG tablet Take 10 mg by mouth daily.   Yes Historical Provider, MD  lovastatin (MEVACOR) 10 MG tablet Take 10 mg by mouth at bedtime.   Yes Historical Provider, MD  naproxen sodium (ANAPROX) 220 MG tablet Take 220 mg by mouth 2 (two) times daily with a meal. Patient used this medication for menstrual cramps.   Yes Historical Provider, MD  glucose blood test strip Use as instructed 11/17/13   Ernestina Patches, MD  HYDROcodone-acetaminophen (NORCO/VICODIN) 5-325 MG tablet Take 2 tablets by mouth every 4 (four) hours as needed. 06/13/15   Tanna Furry, MD  insulin aspart protamine-insulin aspart (NOVOLOG 70/30) (70-30) 100 UNIT/ML injection Inject 20 Units into the skin 2 (two) times daily with a meal. Patient uses 20 units in the am, and 20 units in the pm.    Historical Provider, MD  naproxen (NAPROSYN) 500 MG tablet Take 1 tablet (500 mg total) by mouth 2 (two) times daily. 06/13/15   Tanna Furry, MD   BP 180/108 mmHg  Pulse 96  Temp(Src) 98.5 F (36.9 C) (Oral)  Resp 22  Ht 5\' 8"  (1.727 m)  Wt 224 lb (101.606 kg)  BMI 34.07 kg/m2  SpO2 100%  LMP 06/12/2015 Physical Exam  Constitutional: She is oriented to person, place, and time. She appears well-developed and well-nourished. No distress.  HENT:  Head: Normocephalic.  Eyes: Conjunctivae are normal. Pupils are equal, round, and reactive to light. No scleral icterus.  Neck: Normal range of motion. Neck supple. No thyromegaly present.  Cardiovascular: Normal rate and regular rhythm.  Exam reveals no  gallop and no friction rub.   No murmur heard. Pulmonary/Chest: Effort normal and breath sounds normal. No respiratory distress. She has no wheezes. She has no rales.  Abdominal: Soft. Bowel sounds are normal. She exhibits no distension. There is no tenderness. There is no rebound.  Musculoskeletal: Normal range of motion.  TTP at left lower costal margin No rash, no vessicles  Neurological: She is alert and oriented to person, place, and time.  Skin: Skin is warm and dry. No rash noted.  Psychiatric: She has a normal mood and affect. Her behavior is normal.    ED Course  Procedures (including critical care time) DIAGNOSTIC STUDIES: Oxygen Saturation is 100% on RA,  normal by my interpretation.    COORDINATION OF CARE: 6:31 PM Discussed treatment plan which includes XR of the left ribs with pt at bedside and pt agreed to plan.  Labs Review Labs Reviewed - No data to display  Imaging Review No results found. I have personally reviewed and evaluated these images as part of my medical decision-making.   EKG Interpretation None      MDM   Final diagnoses:  Costochondritis    I personally performed the services described in this documentation, which was scribed in my presence. The recorded information has been reviewed and is accurate.    EKG Interpretation None        Tanna Furry, MD 06/19/15 1455

## 2015-06-13 NOTE — Discharge Instructions (Signed)
Costochondritis  Costochondritis is a condition in which the tissue (cartilage) that connects your ribs with your breastbone (sternum) becomes irritated. It causes pain in the chest and rib area. It usually goes away on its own over time.  HOME CARE  · Avoid activities that wear you out.  · Do not strain your ribs. Avoid activities that use your:    Chest.    Belly.    Side muscles.  · Put ice on the area for the first 2 days after the pain starts.    Put ice in a plastic bag.    Place a towel between your skin and the bag.    Leave the ice on for 20 minutes, 2-3 times a day.  · Only take medicine as told by your doctor.  GET HELP IF:  · You have redness or puffiness (swelling) in the rib area.  · Your pain does not go away with rest or medicine.  GET HELP RIGHT AWAY IF:   · Your pain gets worse.  · You are very uncomfortable.  · You have trouble breathing.  · You cough up blood.  · You start sweating or throwing up (vomiting).  · You have a fever or lasting symptoms for more than 2-3 days.  · You have a fever and your symptoms suddenly get worse.  MAKE SURE YOU:   · Understand these instructions.  · Will watch your condition.  · Will get help right away if you are not doing well or get worse.     This information is not intended to replace advice given to you by your health care provider. Make sure you discuss any questions you have with your health care provider.     Document Released: 09/04/2007 Document Revised: 11/18/2012 Document Reviewed: 10/20/2012  Elsevier Interactive Patient Education ©2016 [employer]

## 2015-12-17 ENCOUNTER — Encounter (HOSPITAL_BASED_OUTPATIENT_CLINIC_OR_DEPARTMENT_OTHER): Payer: Self-pay | Admitting: Emergency Medicine

## 2015-12-17 ENCOUNTER — Emergency Department (HOSPITAL_BASED_OUTPATIENT_CLINIC_OR_DEPARTMENT_OTHER)
Admission: EM | Admit: 2015-12-17 | Discharge: 2015-12-17 | Disposition: A | Discharge status: Home / Self Care | Payer: BLUE CROSS/BLUE SHIELD | Attending: Dermatology | Admitting: Dermatology

## 2015-12-17 DIAGNOSIS — E11649 Type 2 diabetes mellitus with hypoglycemia without coma: Secondary | ICD-10-CM | POA: Diagnosis not present

## 2015-12-17 DIAGNOSIS — I10 Essential (primary) hypertension: Secondary | ICD-10-CM | POA: Diagnosis not present

## 2015-12-17 DIAGNOSIS — Z794 Long term (current) use of insulin: Secondary | ICD-10-CM | POA: Insufficient documentation

## 2015-12-17 DIAGNOSIS — Z5321 Procedure and treatment not carried out due to patient leaving prior to being seen by health care provider: Secondary | ICD-10-CM | POA: Diagnosis not present

## 2015-12-17 HISTORY — DX: Essential (primary) hypertension: I10

## 2015-12-17 HISTORY — DX: Polyneuropathy, unspecified: G62.9

## 2015-12-17 LAB — CBG MONITORING, ED
GLUCOSE-CAPILLARY: 91 mg/dL (ref 65–99)
GLUCOSE-CAPILLARY: 116 mg/dL — AB (ref 65–99)

## 2015-12-17 NOTE — ED Notes (Addendum)
Patient states she feels a "little better" after eating.  Blood glucose recheck-116.  Patient notified she has a little bit longer until being called for room.  States "I only wanted my blood sugar checked anyways".  States she will return to lobby to wait.

## 2015-12-17 NOTE — ED Notes (Addendum)
Called for room x1. No answer. 

## 2015-12-17 NOTE — ED Triage Notes (Addendum)
Patient reports concerned about low blood glucose this AM.  States she took 10 units of novolog at 0930 this morning and ate and then started feeling diaphoretic and "shaky".  States she does not have anymore strips at home to check her blood glucose so came to ER to make sure it was okay.  States blood glucose is normally around 200-300. States she continues to feel "jittery".  Current blood glucose 90.  Given gingerale, graham crackers and peanut butter.

## 2015-12-17 NOTE — ED Notes (Signed)
Per registration-patient left AMA after triage.

## 2016-07-02 ENCOUNTER — Emergency Department (HOSPITAL_BASED_OUTPATIENT_CLINIC_OR_DEPARTMENT_OTHER)
Admission: EM | Admit: 2016-07-02 | Discharge: 2016-07-03 | Disposition: A | Discharge status: Home / Self Care | Payer: BLUE CROSS/BLUE SHIELD | Attending: Emergency Medicine | Admitting: Emergency Medicine

## 2016-07-02 ENCOUNTER — Encounter (HOSPITAL_BASED_OUTPATIENT_CLINIC_OR_DEPARTMENT_OTHER): Payer: Self-pay

## 2016-07-02 DIAGNOSIS — Z794 Long term (current) use of insulin: Secondary | ICD-10-CM | POA: Insufficient documentation

## 2016-07-02 DIAGNOSIS — L089 Local infection of the skin and subcutaneous tissue, unspecified: Secondary | ICD-10-CM

## 2016-07-02 DIAGNOSIS — I10 Essential (primary) hypertension: Secondary | ICD-10-CM | POA: Insufficient documentation

## 2016-07-02 DIAGNOSIS — E119 Type 2 diabetes mellitus without complications: Secondary | ICD-10-CM | POA: Diagnosis not present

## 2016-07-02 HISTORY — DX: Discoid lupus erythematosus of right eye, unspecified eyelid: H01.123

## 2016-07-02 MED ORDER — HYDROCODONE-ACETAMINOPHEN 5-325 MG PO TABS: 1.0000 | ORAL_TABLET | Freq: Four times a day (QID) | ORAL | 0 refills | Status: AC | PRN

## 2016-07-02 MED ORDER — DOXYCYCLINE HYCLATE 100 MG PO TABS
100.0000 mg | ORAL_TABLET | Freq: Once | ORAL | Status: AC
  Administered 2016-07-02: 100 mg via ORAL
  Filled 2016-07-02: qty 1

## 2016-07-02 MED ORDER — VALACYCLOVIR HCL 500 MG PO TABS
1000.0000 mg | ORAL_TABLET | Freq: Once | ORAL | Status: AC
  Administered 2016-07-02: 1000 mg via ORAL
  Filled 2016-07-02: qty 2

## 2016-07-02 MED ORDER — VALACYCLOVIR HCL 1 G PO TABS: 1000.0000 mg | ORAL_TABLET | Freq: Three times a day (TID) | ORAL | 0 refills | Status: AC

## 2016-07-02 MED ORDER — DOXYCYCLINE HYCLATE 100 MG PO CAPS: 100.0000 mg | ORAL_CAPSULE | Freq: Two times a day (BID) | ORAL | 0 refills | Status: AC

## 2016-07-02 MED ORDER — HYDROCODONE-ACETAMINOPHEN 5-325 MG PO TABS
2.0000 | ORAL_TABLET | Freq: Once | ORAL | Status: AC
  Administered 2016-07-02: 2 via ORAL
  Filled 2016-07-02: qty 2

## 2016-07-02 NOTE — ED Provider Notes (Signed)
[place] [facility] Provider Note   CSN: [id number] Arrival date & time: 07/02/16  2049  By signing my name below, I, Dora Sims, attest that this documentation has been prepared under the direction and in the presence of Nicolaos Mitrano, PA-C. Electronically Signed: Dora Sims, Scribe. 07/02/2016. 11:07 PM.  History   Chief Complaint Chief Complaint  Patient presents with  . Recurrent Skin Infections    The history is provided by the patient. No language interpreter was used.     HPI Comments: Gabrielle Gill is a 51 y.o. female with PMHx including DM and HTN who presents to the Emergency Department complaining of a moderate, gradually worsening area of pain and swelling to her back beginning 5 days ago. She states the area has increased in size and severity of pain since onset. Pt reports her son squeezed the area once and achieved a small amount of malodorous, bloody, pus-like drainage. Pt has tried Tylenol without significant improvement of her pain. No h/o the same. She measured her blood glucose at 203 PTA today. Pt denies abdominal pain, nausea, vomiting, fevers, spontaneous drainage from the area, or any other associated symptoms.    Past Medical History:  Diagnosis Date  . Diabetes mellitus   . Hypertension   . Lupus, erythematosus discoid, eyelid, right   . Neuropathy ([place])     There are no active problems to display for this patient.   Past Surgical History:  Procedure Laterality Date  . BREAST SURGERY      OB History    No data available       Home Medications    Prior to Admission medications   Medication Sig Start Date End Date Taking? Authorizing Provider  doxycycline (VIBRAMYCIN) 100 MG capsule Take 1 capsule (100 mg total) by mouth 2 (two) times daily. 07/02/16 07/09/16  Leliana Kontz C Clairessa Boulet, PA-C  glucose blood test strip Use as instructed 11/17/13   Ernestina Patches, MD  HYDROcodone-acetaminophen (NORCO/VICODIN) 5-325 MG tablet Take 1-2 tablets by mouth every  6 (six) hours as needed. 07/02/16   Farmer Mccahill C Lekeshia Kram, PA-C  insulin aspart protamine-insulin aspart (NOVOLOG 70/30) (70-30) 100 UNIT/ML injection Inject 20 Units into the skin 2 (two) times daily with a meal. Patient uses 20 units in the am, and 20 units in the pm.    Historical Provider, MD  insulin glargine (LANTUS) 100 UNIT/ML injection Inject 0.25 mLs (25 Units total) into the skin at bedtime. 11/17/13   Ernestina Patches, MD  lisinopril (PRINIVIL,ZESTRIL) 10 MG tablet Take 10 mg by mouth daily.    Historical Provider, MD  lovastatin (MEVACOR) 10 MG tablet Take 10 mg by mouth at bedtime.    Historical Provider, MD  naproxen (NAPROSYN) 500 MG tablet Take 1 tablet (500 mg total) by mouth 2 (two) times daily. 06/13/15   Tanna Furry, MD  naproxen sodium (ANAPROX) 220 MG tablet Take 220 mg by mouth 2 (two) times daily with a meal. Patient used this medication for menstrual cramps.    Historical Provider, MD  valACYclovir (VALTREX) 1000 MG tablet Take 1 tablet (1,000 mg total) by mouth 3 (three) times daily. 07/02/16   Lorayne Bender, PA-C    Family History No family history on file.  Social History Social History  Substance Use Topics  . Smoking status: Never Smoker  . Smokeless tobacco: Never Used  . Alcohol use No     Allergies   Patient has no known allergies.   Review of Systems Review of Systems  Constitutional: Negative for fever.  Gastrointestinal: Negative for abdominal pain, nausea and vomiting.  Skin: Positive for color change.       Painful skin abnormality.  All other systems reviewed and are negative.  Physical Exam Updated Vital Signs BP (!) 166/100 (BP Location: Left Arm)   Pulse (!) 110   Temp 98.6 F (37 C) (Oral)   Resp 20   Ht 5\' 8"  (1.727 m)   Wt 236 lb (107 kg)   LMP 11/27/2015   SpO2 98%   BMI 35.88 kg/m   Physical Exam  Constitutional: She appears well-developed and well-nourished. No distress.  HENT:  Head: Normocephalic and atraumatic.  Eyes: Conjunctivae are  normal.  Neck: Neck supple.  Cardiovascular: Normal rate, regular rhythm, normal heart sounds and intact distal pulses.   Pulmonary/Chest: Effort normal and breath sounds normal. No respiratory distress.  Abdominal: Soft. There is no tenderness. There is no guarding.  Musculoskeletal: She exhibits tenderness. She exhibits no edema.  Lymphadenopathy:    She has no cervical adenopathy.  Neurological: She is alert.  Skin: Skin is warm and dry. She is not diaphoretic. There is erythema.  Back: Subcutaneous fluid-filled area measuring about 4 cm x 1.5 cm having the appearance of confluent vesicles. The area of surrounding redness is 7 cm x 4 cm. Area of tenderness is about 15 cm x 7 cm, but is unilateral, does not cross the midline, and follows a dermatomal distribution.   Psychiatric: She has a normal mood and affect. Her behavior is normal.  Nursing note and vitals reviewed.       ED Treatments / Results  Labs (all labs ordered are listed, but only abnormal results are displayed) Labs Reviewed - No data to display  EKG  EKG Interpretation None       Radiology No results found.  Procedures Procedures (including critical care time)  EMERGENCY DEPARTMENT US SOFT TISSUE INTERPRETATION "Study: Limited Soft Tissue Ultrasound"  INDICATIONS: Pain and Soft tissue infection Multiple views of the body part were obtained in real-time with a multi-frequency linear probe  PERFORMED BY: Myself IMAGES ARCHIVED?: Yes SIDE:Right  BODY PART:Lower back INTERPRETATION:  No abcess noted and No cellulitis noted    DIAGNOSTIC STUDIES: Oxygen Saturation is 98% on RA, normal by my interpretation.    COORDINATION OF CARE: 11:14 PM Discussed treatment plan with pt at bedside and pt agreed to plan.  Medications Ordered in ED Medications  HYDROcodone-acetaminophen (NORCO/VICODIN) 5-325 MG per tablet 2 tablet (2 tablets Oral Given 07/02/16 2357)  valACYclovir (VALTREX) tablet 1,000 mg (1,000  mg Oral Given 07/02/16 2357)  doxycycline (VIBRA-TABS) tablet 100 mg (100 mg Oral Given 07/02/16 2357)     Initial Impression / Assessment and Plan / ED Course  I have reviewed the triage vital signs and the nursing notes.  Pertinent labs & imaging results that were available during my care of the patient were reviewed by me and considered in my medical decision making (see chart for details).     Patient presents with area of pain to the lower right back. Area could represent area of skin infection, however, I have a strong suspicion of shingles as the area appears to be vesicular, it is unilateral, and she is tender in a dermatomal distribution. She does not appear to have systemic symptoms. Nontoxic appearing. Doubt sepsis. Antibiotic and antiviral therapies initiated. PCP follow up. The patient was given instructions for home care as well as return precautions. Patient voices understanding of these instructions,  accepts the plan, and is comfortable with discharge.  Findings and plan of care discussed with [facility], DO, including review of photos.   Vitals:   07/02/16 2058 07/03/16 0002  BP: (!) 166/100 (!) 147/83  Pulse: (!) 110 98  Resp: 20 18  Temp: 98.6 F (37 C) 98.3 F (36.8 C)  TempSrc: Oral Oral  SpO2: 98% 99%  Weight: 107 kg   Height: 5\' 8"  (1.727 m)      Final Clinical Impressions(s) / ED Diagnoses   Final diagnoses:  Skin infection    New Prescriptions Discharge Medication List as of 07/02/2016 11:44 PM    START taking these medications   Details  doxycycline (VIBRAMYCIN) 100 MG capsule Take 1 capsule (100 mg total) by mouth 2 (two) times daily., Starting Tue 07/02/2016, Until Tue 07/09/2016, Print    HYDROcodone-acetaminophen (NORCO/VICODIN) 5-325 MG tablet Take 1-2 tablets by mouth every 6 (six) hours as needed., Starting Tue 07/02/2016, Print    valACYclovir (VALTREX) 1000 MG tablet Take 1 tablet (1,000 mg total) by mouth 3 (three) times daily., Starting Tue  07/02/2016, Print       I personally performed the services described in this documentation, which was scribed in my presence. The recorded information has been reviewed and is accurate.   Lorayne Bender, PA-C 07/03/16 [place], DO 07/03/16 [id number]

## 2016-07-02 NOTE — ED Triage Notes (Signed)
c/o "boil" to lower back x 4-5 days-NAD-slow steady gait

## 2016-07-02 NOTE — Discharge Instructions (Signed)
You have evidence of a skin infection. This could be due to a bacteria, but an outbreak of shingles is highly likely.  Please take all of your antibiotics until finished!   You may develop abdominal discomfort or diarrhea from the antibiotic.  You may help offset this with probiotics which you can buy or get in yogurt. Do not eat or take the probiotics until 2 hours after your antibiotic.  Take the antiviral medications until finished. May use Tylenol and/or ibuprofen for pain. Vicodin for severe pain. Do not drive or perform other dangerous activities while taking the Vicodin.

## 2016-07-03 DIAGNOSIS — I10 Essential (primary) hypertension: Secondary | ICD-10-CM | POA: Diagnosis present

## 2016-07-04 ENCOUNTER — Emergency Department (HOSPITAL_BASED_OUTPATIENT_CLINIC_OR_DEPARTMENT_OTHER): Payer: BLUE CROSS/BLUE SHIELD

## 2016-07-04 ENCOUNTER — Encounter (HOSPITAL_BASED_OUTPATIENT_CLINIC_OR_DEPARTMENT_OTHER): Payer: Self-pay | Admitting: Emergency Medicine

## 2016-07-04 ENCOUNTER — Emergency Department (HOSPITAL_BASED_OUTPATIENT_CLINIC_OR_DEPARTMENT_OTHER)
Admission: EM | Admit: 2016-07-04 | Discharge: 2016-07-04 | Disposition: A | Discharge status: Other Acute Inpatient Hospital | Payer: BLUE CROSS/BLUE SHIELD | Attending: Emergency Medicine | Admitting: Emergency Medicine

## 2016-07-04 DIAGNOSIS — L02219 Cutaneous abscess of trunk, unspecified: Secondary | ICD-10-CM

## 2016-07-04 DIAGNOSIS — R739 Hyperglycemia, unspecified: Secondary | ICD-10-CM

## 2016-07-04 DIAGNOSIS — E1165 Type 2 diabetes mellitus with hyperglycemia: Secondary | ICD-10-CM | POA: Insufficient documentation

## 2016-07-04 DIAGNOSIS — Z794 Long term (current) use of insulin: Secondary | ICD-10-CM | POA: Insufficient documentation

## 2016-07-04 DIAGNOSIS — L02212 Cutaneous abscess of back [any part, except buttock]: Secondary | ICD-10-CM | POA: Diagnosis present

## 2016-07-04 DIAGNOSIS — L03312 Cellulitis of back [any part except buttock]: Secondary | ICD-10-CM | POA: Diagnosis not present

## 2016-07-04 DIAGNOSIS — I10 Essential (primary) hypertension: Secondary | ICD-10-CM | POA: Insufficient documentation

## 2016-07-04 DIAGNOSIS — L03319 Cellulitis of trunk, unspecified: Secondary | ICD-10-CM

## 2016-07-04 DIAGNOSIS — Z79899 Other long term (current) drug therapy: Secondary | ICD-10-CM | POA: Insufficient documentation

## 2016-07-04 LAB — CBC WITH DIFFERENTIAL/PLATELET
BASOS ABS: 0 10*3/uL (ref 0.0–0.1)
Basophils Relative: 0 %
EOS PCT: 1 %
Eosinophils Absolute: 0.1 10*3/uL (ref 0.0–0.7)
HCT: 32.6 % — ABNORMAL LOW (ref 36.0–46.0)
Hemoglobin: 10.6 g/dL — ABNORMAL LOW (ref 12.0–15.0)
LYMPHS ABS: 1.4 10*3/uL (ref 0.7–4.0)
Lymphocytes Relative: 14 %
MCH: 26.3 pg (ref 26.0–34.0)
MCHC: 32.5 g/dL (ref 30.0–36.0)
MCV: 80.9 fL (ref 78.0–100.0)
MONO ABS: 0.8 10*3/uL (ref 0.1–1.0)
MONOS PCT: 8 %
Neutro Abs: 7.8 10*3/uL — ABNORMAL HIGH (ref 1.7–7.7)
Neutrophils Relative %: 77 %
Platelets: 391 10*3/uL (ref 150–400)
RBC: 4.03 MIL/uL (ref 3.87–5.11)
RDW: 12.9 % (ref 11.5–15.5)
WBC: 10.1 10*3/uL (ref 4.0–10.5)

## 2016-07-04 LAB — COMPREHENSIVE METABOLIC PANEL
ALT: 13 U/L — ABNORMAL LOW (ref 14–54)
ANION GAP: 10 (ref 5–15)
AST: 21 U/L (ref 15–41)
Albumin: 3.4 g/dL — ABNORMAL LOW (ref 3.5–5.0)
Alkaline Phosphatase: 98 U/L (ref 38–126)
BILIRUBIN TOTAL: 0.6 mg/dL (ref 0.3–1.2)
BUN: 14 mg/dL (ref 6–20)
CHLORIDE: 91 mmol/L — AB (ref 101–111)
CO2: 25 mmol/L (ref 22–32)
Calcium: 8.9 mg/dL (ref 8.9–10.3)
Creatinine, Ser: 1 mg/dL (ref 0.44–1.00)
Glucose, Bld: 505 mg/dL (ref 65–99)
POTASSIUM: 4.2 mmol/L (ref 3.5–5.1)
Sodium: 126 mmol/L — ABNORMAL LOW (ref 135–145)
TOTAL PROTEIN: 8 g/dL (ref 6.5–8.1)

## 2016-07-04 LAB — CBG MONITORING, ED: Glucose-Capillary: 255 mg/dL — ABNORMAL HIGH (ref 65–99)

## 2016-07-04 LAB — I-STAT CG4 LACTIC ACID, ED: LACTIC ACID, VENOUS: 1.76 mmol/L (ref 0.5–1.9)

## 2016-07-04 MED ORDER — ACETAMINOPHEN 500 MG PO TABS
ORAL_TABLET | ORAL | Status: AC
  Filled 2016-07-04: qty 2

## 2016-07-04 MED ORDER — SODIUM CHLORIDE 0.9 % IV BOLUS (SEPSIS)
1000.0000 mL | Freq: Once | INTRAVENOUS | Status: AC
  Administered 2016-07-04: 1000 mL via INTRAVENOUS

## 2016-07-04 MED ORDER — IBUPROFEN 800 MG PO TABS
800.0000 mg | ORAL_TABLET | Freq: Once | ORAL | Status: AC
  Administered 2016-07-04: 800 mg via ORAL

## 2016-07-04 MED ORDER — ACETAMINOPHEN 500 MG PO TABS
1000.0000 mg | ORAL_TABLET | Freq: Once | ORAL | Status: AC
  Administered 2016-07-04: 1000 mg via ORAL

## 2016-07-04 MED ORDER — ACETAMINOPHEN 325 MG PO TABS: 650.0000 mg | ORAL_TABLET | Freq: Once | ORAL | Status: DC

## 2016-07-04 MED ORDER — IOPAMIDOL (ISOVUE-300) INJECTION 61%
100.0000 mL | Freq: Once | INTRAVENOUS | Status: AC | PRN
  Administered 2016-07-04: 100 mL via INTRAVENOUS

## 2016-07-04 MED ORDER — VANCOMYCIN HCL IN DEXTROSE 1-5 GM/200ML-% IV SOLN
1000.0000 mg | Freq: Once | INTRAVENOUS | Status: AC
  Administered 2016-07-04: 1000 mg via INTRAVENOUS
  Filled 2016-07-04: qty 200

## 2016-07-04 MED ORDER — INSULIN REGULAR HUMAN 100 UNIT/ML IJ SOLN
10.0000 [IU] | Freq: Once | INTRAMUSCULAR | Status: AC
  Administered 2016-07-04: 10 [IU] via INTRAVENOUS
  Filled 2016-07-04: qty 1

## 2016-07-04 MED ORDER — LIDOCAINE-EPINEPHRINE 2 %-1:100000 IJ SOLN
20.0000 mL | Freq: Once | INTRAMUSCULAR | Status: AC
  Administered 2016-07-04: 20 mL
  Filled 2016-07-04: qty 1

## 2016-07-04 MED ORDER — PIPERACILLIN-TAZOBACTAM 3.375 G IVPB
3.3750 g | Freq: Once | INTRAVENOUS | Status: AC
  Administered 2016-07-04: 3.375 g via INTRAVENOUS
  Filled 2016-07-04: qty 50

## 2016-07-04 MED ORDER — IBUPROFEN 800 MG PO TABS
ORAL_TABLET | ORAL | Status: AC
  Administered 2016-07-04: 800 mg via ORAL
  Filled 2016-07-04: qty 1

## 2016-07-04 NOTE — ED Notes (Signed)
Seen here x 2days ago  Was dx with shingles.  States today is painful and a nurse friend told her it was an abscess and needed to be drained

## 2016-07-04 NOTE — ED Provider Notes (Signed)
[facility] Provider Note   CSN: [id number] Arrival date & time: 07/04/16  1632  By signing my name below, I, Gabrielle Gill, attest that this documentation has been prepared under the direction and in the presence of Gabrielle Rice, MD. Electronically Signed: Sonum Gill, Education administrator. 07/04/16. 6:11 PM.  History   Chief Complaint Chief Complaint  Patient presents with  . Abscess    The history is provided by the patient. No language interpreter was used.     HPI Comments: Gabrielle Gill is a 51 y.o. female who presents to the Emergency Department complaining of a constant, gradually worsened area of swelling and rednessTo the right flank. Recently seen in emergency department and treated with antibiotics and antiviral medication for suspected secondary infection of herpes zoster outbreak. Has had increased pain and swelling to the area culminating in a "head". This ruptured yesterday draining yellow and green foul-smelling discharge. She's had increased pain, subjective fevers and shaking chills. Denies nausea or vomiting. No abdominal pain.  Past Medical History:  Diagnosis Date  . Diabetes mellitus   . Hypertension   . Lupus, erythematosus discoid, eyelid, right   . Neuropathy ([place])     There are no active problems to display for this patient.   Past Surgical History:  Procedure Laterality Date  . BREAST SURGERY      OB History    No data available       Home Medications    Prior to Admission medications   Medication Sig Start Date End Date Taking? Authorizing Provider  amitriptyline (ELAVIL) 10 MG tablet Take 10 mg by mouth at bedtime.   Yes Historical Provider, MD  amLODipine (NORVASC) 10 MG tablet Take 10 mg by mouth daily.   Yes Historical Provider, MD  insulin aspart protamine-insulin aspart (NOVOLOG 70/30) (70-30) 100 UNIT/ML injection Inject 20 Units into the skin 2 (two) times daily with a meal. Patient uses 20 units in the am, and 20 units in the pm.    Yes Historical Provider, MD  insulin glargine (LANTUS) 100 UNIT/ML injection Inject 0.25 mLs (25 Units total) into the skin at bedtime. 11/17/13  Yes Ernestina Patches, MD  lisinopril (PRINIVIL,ZESTRIL) 10 MG tablet Take 10 mg by mouth daily.   Yes Historical Provider, MD  lovastatin (MEVACOR) 10 MG tablet Take 10 mg by mouth at bedtime.   Yes Historical Provider, MD  doxycycline (VIBRAMYCIN) 100 MG capsule Take 1 capsule (100 mg total) by mouth 2 (two) times daily. 07/02/16 07/09/16  Shawn C Joy, PA-C  glucose blood test strip Use as instructed 11/17/13   Ernestina Patches, MD  HYDROcodone-acetaminophen (NORCO/VICODIN) 5-325 MG tablet Take 1-2 tablets by mouth every 6 (six) hours as needed. 07/02/16   Shawn C Joy, PA-C  naproxen (NAPROSYN) 500 MG tablet Take 1 tablet (500 mg total) by mouth 2 (two) times daily. 06/13/15   Tanna Furry, MD  naproxen sodium (ANAPROX) 220 MG tablet Take 220 mg by mouth 2 (two) times daily with a meal. Patient used this medication for menstrual cramps.    Historical Provider, MD  valACYclovir (VALTREX) 1000 MG tablet Take 1 tablet (1,000 mg total) by mouth 3 (three) times daily. 07/02/16   Lorayne Bender, PA-C    Family History No family history on file.  Social History Social History  Substance Use Topics  . Smoking status: Never Smoker  . Smokeless tobacco: Never Used  . Alcohol use No     Allergies   Patient has no known allergies.  Review of Systems Review of Systems  Constitutional: Positive for chills, fatigue and fever.  Gastrointestinal: Negative for abdominal pain, nausea and vomiting.  Genitourinary: Positive for flank pain. Negative for dysuria, frequency and hematuria.  Musculoskeletal: Positive for back pain and myalgias.  Skin: Positive for rash and wound.  Neurological: Negative for dizziness, weakness, light-headedness and numbness.  All other systems reviewed and are negative.    Physical Exam Updated Vital Signs BP (!) 162/85 (BP Location: Left  Arm)   Pulse 98   Temp 100.1 F (37.8 C) (Oral)   Resp 16   Ht 5\' 8"  (1.727 m)   Wt 236 lb (107 kg)   LMP 11/27/2015   SpO2 99%   BMI 35.88 kg/m   Physical Exam  Constitutional: She is oriented to person, place, and time. She appears well-developed and well-nourished. She appears distressed.  HENT:  Head: Normocephalic and atraumatic.  Mouth/Throat: Oropharynx is clear and moist. No oropharyngeal exudate.  Eyes: EOM are normal. Pupils are equal, round, and reactive to light.  Neck: Normal range of motion. Neck supple.  No meningismus  Cardiovascular: Regular rhythm.   Tachycardia  Pulmonary/Chest: Effort normal and breath sounds normal. No respiratory distress. She has no wheezes. She has no rales. She exhibits no tenderness.  Abdominal: Soft. Bowel sounds are normal. There is no tenderness. There is no rebound and no guarding.  Musculoskeletal: Normal range of motion. She exhibits tenderness. She exhibits no edema.  Patient has area of induration and erythema roughly 10 cm x 10 cm over the right flank. Tender to palpation. There are a few pustules at the center of the indurated area. No fluctuance.  Neurological: She is alert and oriented to person, place, and time.  Moving all extremities without deficit. Sensation intact. Ambulating without difficulty.  Skin: Skin is warm and dry. Capillary refill takes less than 2 seconds. No rash noted. There is erythema.  Psychiatric: She has a normal mood and affect. Her behavior is normal.  Nursing note and vitals reviewed.    ED Treatments / Results  DIAGNOSTIC STUDIES: Oxygen Saturation is 100% on RA, normal by my interpretation.    COORDINATION OF CARE: 6:11 PM Discussed treatment plan with pt at bedside and pt agreed to plan.   Labs (all labs ordered are listed, but only abnormal results are displayed) Labs Reviewed  CBC WITH DIFFERENTIAL/PLATELET - Abnormal; Notable for the following:       Result Value   Hemoglobin 10.6 (*)     HCT 32.6 (*)    Neutro Abs 7.8 (*)    All other components within normal limits  COMPREHENSIVE METABOLIC PANEL - Abnormal; Notable for the following:    Sodium 126 (*)    Chloride 91 (*)    Glucose, Bld 505 (*)    Albumin 3.4 (*)    ALT 13 (*)    All other components within normal limits  CBG MONITORING, ED - Abnormal; Notable for the following:    Glucose-Capillary 255 (*)    All other components within normal limits  CULTURE, BLOOD (ROUTINE X 2)  CULTURE, BLOOD (ROUTINE X 2)  AEROBIC CULTURE (SUPERFICIAL SPECIMEN)  I-STAT CG4 LACTIC ACID, ED    EKG  EKG Interpretation None       Radiology Ct Abdomen Pelvis W Contrast  Result Date: 07/04/2016 CLINICAL DATA:  Right flank abscess near spine at T11 and T12 x1 week with fever, chills and pain. History of lupus, hypertension, diabetes. EXAM: CT ABDOMEN AND PELVIS  WITH CONTRAST TECHNIQUE: Multidetector CT imaging of the abdomen and pelvis was performed using the standard protocol following bolus administration of intravenous contrast. CONTRAST:  168mL ISOVUE-300 IOPAMIDOL (ISOVUE-300) INJECTION 61% COMPARISON:  None. FINDINGS: Lower chest: No acute abnormality. Hepatobiliary: Anterior right hepatic hypodensity measuring 13 mm statistically consistent with a cyst or hemangioma. Given lack of significant enhancement on the delayed imaging, findings are more likely to represent a cyst. No solid enhancing abnormality nor biliary dilatation. Contracted gallbladder without stones. Pancreas: Unremarkable. No pancreatic ductal dilatation or surrounding inflammatory changes. Spleen: Normal in size without focal abnormality. Adrenals/Urinary Tract: Normal bilateral adrenal glands. Symmetric enhancement of both kidneys without nephrolithiasis, renal mass or obstructive uropathy. Physiologic distention of the bladder. Stomach/Bowel: Contracted stomach with normal small bowel rotation. No bowel obstruction. Moderate fecal residue within large bowel  without obstruction. Normal appendix. Vascular/Lymphatic: A few retroperitoneal calcified densities may represent small calcified lymph nodes or potentially vascular calcifications along the course of the mid ureters. Some of these calcifications may be related to the ovaries. No aortic aneurysm. Minimal aortic atherosclerosis. Reproductive: Bilateral tubal ligations. No uterine or adnexal mass. Other: No abdominal wall hernia or abnormality. No abdominopelvic ascites. Musculoskeletal: Subcutaneous soft tissue induration with small focus of gas overlying the right erector spinae muscle at T12 and L1. Findings are in keeping with cellulitis with a small 2 cm gas containing abscess just deep to the skin. No frank bone destruction. No intramuscular abscess is identified. IMPRESSION: 1. Subcutaneous fatty induration overlying the right flank at approximately T12 and L1 consistent with cellulitis with small 2 cm abscess containing a focus of gas within. 2. 1.3 cm right hepatic hypodensity statistically consistent with a cyst or hemangioma. Given lack of significant enhancement on delayed repeat imaging, findings are more likely to represent a cyst. Electronically Signed   By: Ashley Royalty M.D.   On: 07/04/2016 19:58    Procedures .Marland KitchenIncision and Drainage Date/Time: 07/04/2016 9:22 PM Performed by: Gabrielle Gill Authorized by: Lita Mains, Oluwatimileyin Vivier   Consent:    Consent obtained:  Verbal   Consent given by:  Patient Location:    Type:  Abscess   Location:  Trunk   Trunk location:  Back Pre-procedure details:    Skin preparation:  Betadine Anesthesia (see MAR for exact dosages):    Anesthesia method:  Local infiltration   Local anesthetic:  Lidocaine 1% WITH epi Procedure type:    Complexity:  Complex Procedure details:    Needle aspiration: yes     Needle size:  22 G   Incision types:  Single straight   Incision depth:  Dermal   Scalpel blade:  11   Wound management:  Probed and deloculated    Drainage:  Purulent   Drainage amount:  Moderate   Wound treatment:  Wound left open   Packing materials:  1/2 in iodoform gauze Post-procedure details:    Patient tolerance of procedure:  Tolerated well, no immediate complications   (including critical care time)  Medications Ordered in ED Medications  acetaminophen (TYLENOL) tablet 1,000 mg (1,000 mg Oral Given 07/04/16 1752)  sodium chloride 0.9 % bolus 1,000 mL (0 mLs Intravenous Stopped 07/04/16 2046)  vancomycin (VANCOCIN) IVPB 1000 mg/200 mL premix (0 mg Intravenous Stopped 07/04/16 2046)  piperacillin-tazobactam (ZOSYN) IVPB 3.375 g (0 g Intravenous Stopped 07/04/16 1926)  iopamidol (ISOVUE-300) 61 % injection 100 mL (100 mLs Intravenous Contrast Given 07/04/16 1906)  insulin regular (NOVOLIN R,HUMULIN R) 250 units/2.96mL (100 units/mL) injection 10 Units (10 Units Intravenous  Given 07/04/16 1937)  sodium chloride 0.9 % bolus 1,000 mL (0 mLs Intravenous Stopped 07/04/16 2052)  lidocaine-EPINEPHrine (XYLOCAINE W/EPI) 2 %-1:100000 (with pres) injection 20 mL (20 mLs Infiltration Given 07/04/16 2222)  ibuprofen (ADVIL,MOTRIN) tablet 800 mg (800 mg Oral Given 07/04/16 2222)     Initial Impression / Assessment and Plan / ED Course  I have reviewed the triage vital signs and the nursing notes.  Pertinent labs & imaging results that were available during my care of the patient were reviewed by me and considered in my medical decision making (see chart for details).     Patient with abscess and cellulitis. Now has constitutional symptoms. Concern for possible sepsis. We'll get CT to better ascertain the extent of abscess. Will draw blood cultures and go ahead and start on antibiotics.  Discussed with [place] [facility] hospitalist [place]. Will accept in transfer. Final Clinical Impressions(s) / ED Diagnoses   Final diagnoses:  Cellulitis and abscess of trunk  Hyperglycemia    New Prescriptions New Prescriptions   No medications on file     I personally performed the services described in this documentation, which was scribed in my presence. The recorded information has been reviewed and is accurate.      Gabrielle Rice, MD 07/04/16 2231

## 2016-07-04 NOTE — ED Notes (Signed)
Report called to Amy at [facility]

## 2016-07-04 NOTE — ED Triage Notes (Addendum)
Abscess to back, seen here and started on abx, drained on its own yesterday, states very painful.

## 2016-07-04 NOTE — ED Notes (Signed)
Page made to [facility]

## 2016-07-05 DIAGNOSIS — E1142 Type 2 diabetes mellitus with diabetic polyneuropathy: Secondary | ICD-10-CM | POA: Diagnosis present

## 2016-07-08 LAB — AEROBIC CULTURE  (SUPERFICIAL SPECIMEN)

## 2016-07-08 LAB — AEROBIC CULTURE W GRAM STAIN (SUPERFICIAL SPECIMEN): Gram Stain: NONE SEEN

## 2016-07-09 ENCOUNTER — Telehealth: Payer: Self-pay | Admitting: Emergency Medicine

## 2016-07-09 LAB — CULTURE, BLOOD (ROUTINE X 2)
Culture: NO GROWTH
Culture: NO GROWTH
SPECIAL REQUESTS: ADEQUATE
Special Requests: ADEQUATE

## 2016-07-09 NOTE — Telephone Encounter (Signed)
Post ED Visit - Positive Culture Follow-up  Culture report reviewed by antimicrobial stewardship pharmacist:  []  Elenor Quinones, Pharm.D. [x]  Heide Guile, Pharm.D., BCPS AQ-ID []  Parks Neptune, Pharm.D., BCPS []  Alycia Rossetti, Pharm.D., BCPS []  Marvin, Pharm.D., BCPS, AAHIVP []  Legrand Como, Pharm.D., BCPS, AAHIVP []  Salome Arnt, PharmD, BCPS []  Dimitri Ped, PharmD, BCPS []  Vincenza Hews, PharmD, BCPS  Positive wound culture Treated with doxycycline, per PharmD request to fax report to [facility] where patient was admitted from [facility], patient has been discharged from [facility]  Hazle Nordmann 07/09/2016, 4:58 PM

## 2018-03-02 ENCOUNTER — Other Ambulatory Visit: Payer: Self-pay

## 2018-03-02 ENCOUNTER — Encounter (HOSPITAL_BASED_OUTPATIENT_CLINIC_OR_DEPARTMENT_OTHER): Payer: Self-pay | Admitting: Emergency Medicine

## 2018-03-02 ENCOUNTER — Emergency Department (HOSPITAL_BASED_OUTPATIENT_CLINIC_OR_DEPARTMENT_OTHER): Payer: BLUE CROSS/BLUE SHIELD

## 2018-03-02 ENCOUNTER — Emergency Department (HOSPITAL_BASED_OUTPATIENT_CLINIC_OR_DEPARTMENT_OTHER)
Admission: EM | Admit: 2018-03-02 | Discharge: 2018-03-02 | Disposition: A | Discharge status: Home / Self Care | Payer: BLUE CROSS/BLUE SHIELD | Attending: Emergency Medicine | Admitting: Emergency Medicine

## 2018-03-02 DIAGNOSIS — R4182 Altered mental status, unspecified: Secondary | ICD-10-CM | POA: Insufficient documentation

## 2018-03-02 DIAGNOSIS — Z794 Long term (current) use of insulin: Secondary | ICD-10-CM | POA: Insufficient documentation

## 2018-03-02 DIAGNOSIS — Z79899 Other long term (current) drug therapy: Secondary | ICD-10-CM | POA: Insufficient documentation

## 2018-03-02 DIAGNOSIS — E1165 Type 2 diabetes mellitus with hyperglycemia: Secondary | ICD-10-CM | POA: Insufficient documentation

## 2018-03-02 DIAGNOSIS — I10 Essential (primary) hypertension: Secondary | ICD-10-CM | POA: Insufficient documentation

## 2018-03-02 DIAGNOSIS — R739 Hyperglycemia, unspecified: Secondary | ICD-10-CM

## 2018-03-02 LAB — CBC WITH DIFFERENTIAL/PLATELET
Abs Immature Granulocytes: 0.03 10*3/uL (ref 0.00–0.07)
BASOS ABS: 0 10*3/uL (ref 0.0–0.1)
Basophils Relative: 0 %
Eosinophils Absolute: 0.1 10*3/uL (ref 0.0–0.5)
Eosinophils Relative: 1 %
HCT: 39.6 % (ref 36.0–46.0)
Hemoglobin: 12.3 g/dL (ref 12.0–15.0)
IMMATURE GRANULOCYTES: 0 %
Lymphocytes Relative: 22 %
Lymphs Abs: 1.6 10*3/uL (ref 0.7–4.0)
MCH: 25.5 pg — ABNORMAL LOW (ref 26.0–34.0)
MCHC: 31.1 g/dL (ref 30.0–36.0)
MCV: 82 fL (ref 80.0–100.0)
Monocytes Absolute: 0.6 10*3/uL (ref 0.1–1.0)
Monocytes Relative: 9 %
Neutro Abs: 5.1 10*3/uL (ref 1.7–7.7)
Neutrophils Relative %: 68 %
Platelets: 383 10*3/uL (ref 150–400)
RBC: 4.83 MIL/uL (ref 3.87–5.11)
RDW: 13.2 % (ref 11.5–15.5)
WBC: 7.5 10*3/uL (ref 4.0–10.5)
nRBC: 0 % (ref 0.0–0.2)

## 2018-03-02 LAB — URINALYSIS, ROUTINE W REFLEX MICROSCOPIC
Bilirubin Urine: NEGATIVE
Glucose, UA: >=500 mg/dL — AB
Hgb urine dipstick: NEGATIVE
Ketones, ur: NEGATIVE mg/dL
Leukocytes, UA: NEGATIVE
Nitrite: NEGATIVE
Protein, ur: 30 mg/dL — AB
Specific Gravity, Urine: 1.025 (ref 1.005–1.030)
pH: 5.5 (ref 5.0–8.0)

## 2018-03-02 LAB — BASIC METABOLIC PANEL
ANION GAP: 10 (ref 5–15)
BUN: 35 mg/dL — ABNORMAL HIGH (ref 6–20)
CO2: 29 mmol/L (ref 22–32)
Calcium: 10.1 mg/dL (ref 8.9–10.3)
Chloride: 98 mmol/L (ref 98–111)
Creatinine, Ser: 1.34 mg/dL — ABNORMAL HIGH (ref 0.44–1.00)
GFR calc Af Amer: 53 mL/min — ABNORMAL LOW (ref >60)
GFR calc non Af Amer: 46 mL/min — ABNORMAL LOW (ref >60)
Glucose, Bld: 225 mg/dL — ABNORMAL HIGH (ref 70–99)
Potassium: 4 mmol/L (ref 3.5–5.1)
Sodium: 137 mmol/L (ref 135–145)

## 2018-03-02 LAB — URINALYSIS, MICROSCOPIC (REFLEX): RBC / HPF: NONE SEEN RBC/hpf (ref 0–5)

## 2018-03-02 LAB — CBG MONITORING, ED: GLUCOSE-CAPILLARY: 204 mg/dL — AB (ref 70–99)

## 2018-03-02 MED ORDER — SODIUM CHLORIDE 0.9 % IV BOLUS
1000.0000 mL | Freq: Once | INTRAVENOUS | Status: AC
  Administered 2018-03-02: 1000 mL via INTRAVENOUS

## 2018-03-02 NOTE — ED Triage Notes (Addendum)
Per niece patient complained of dizziness which began yesterday.  States her blood sugar was high at home yesterday.  Patient affect flat but also tearful at times.  Patient states she is confused, slow to respond to questions.  Unsure of age.  Son states blood sugar at home yesterday was 800 but no record of this on glucometer at home.  Per son-patient woke up with this yesterday morning.

## 2018-03-02 NOTE — ED Provider Notes (Signed)
[place] [facility] EMERGENCY DEPARTMENT Provider Note   CSN: [id number] Arrival date & time: 03/02/18  5361     History   Chief Complaint Chief Complaint  Patient presents with  . Altered Mental Status    HPI Gabrielle Gill is a 52 y.o. female.  The history is provided by the patient.  Hyperglycemia  Blood sugar level PTA:  >600 Severity:  Moderate Onset quality:  Gradual Timing:  Constant Progression:  Unchanged Chronicity:  Chronic Diabetes status:  Controlled with insulin Context: not noncompliance   Relieved by:  Nothing Ineffective treatments:  None tried Associated symptoms: altered mental status and confusion (depressed)   Associated symptoms: no abdominal pain, no blurred vision, no chest pain, no dehydration, no diaphoresis, no dizziness, no dysuria, no fever, no increased appetite, no increased thirst, no nausea, no polyuria, no shortness of breath, no vomiting, no weakness and no weight change   Risk factors: obesity     Past Medical History:  Diagnosis Date  . Diabetes mellitus   . Hypertension   . Lupus, erythematosus discoid, eyelid, right   . Neuropathy     There are no active problems to display for this patient.   Past Surgical History:  Procedure Laterality Date  . BREAST SURGERY       OB History   None      Home Medications    Prior to Admission medications   Medication Sig Start Date End Date Taking? Authorizing Provider  amitriptyline (ELAVIL) 10 MG tablet Take 10 mg by mouth at bedtime.    [provider]  amLODipine (NORVASC) 10 MG tablet Take 10 mg by mouth daily.    [provider]  glucose blood test strip Use as instructed 11/17/13   Ernestina Patches, MD  HYDROcodone-acetaminophen (NORCO/VICODIN) 5-325 MG tablet Take 1-2 tablets by mouth every 6 (six) hours as needed. 07/02/16   Joy, Shawn C, PA-C  insulin aspart protamine-insulin aspart (NOVOLOG 70/30) (70-30) 100 UNIT/ML injection Inject 20 Units into the  skin 2 (two) times daily with a meal. Patient uses 20 units in the am, and 20 units in the pm.    [provider]  insulin glargine (LANTUS) 100 UNIT/ML injection Inject 0.25 mLs (25 Units total) into the skin at bedtime. 11/17/13   Ernestina Patches, MD  lisinopril (PRINIVIL,ZESTRIL) 10 MG tablet Take 10 mg by mouth daily.    [provider]  lovastatin (MEVACOR) 10 MG tablet Take 10 mg by mouth at bedtime.    [provider]  naproxen (NAPROSYN) 500 MG tablet Take 1 tablet (500 mg total) by mouth 2 (two) times daily. 06/13/15   Tanna Furry, MD  naproxen sodium (ANAPROX) 220 MG tablet Take 220 mg by mouth 2 (two) times daily with a meal. Patient used this medication for menstrual cramps.    [provider]  valACYclovir (VALTREX) 1000 MG tablet Take 1 tablet (1,000 mg total) by mouth 3 (three) times daily. 07/02/16   Lorayne Bender, PA-C    Family History History reviewed. No pertinent family history.  Social History Social History   Tobacco Use  . Smoking status: Never Smoker  . Smokeless tobacco: Never Used  Substance Use Topics  . Alcohol use: No  . Drug use: No     Allergies   Patient has no known allergies.   Review of Systems Review of Systems  Constitutional: Negative for chills, diaphoresis and fever.  HENT: Negative for ear pain and sore throat.   Eyes:  Negative for blurred vision, pain and visual disturbance.  Respiratory: Negative for cough and shortness of breath.   Cardiovascular: Negative for chest pain and palpitations.  Gastrointestinal: Negative for abdominal pain, nausea and vomiting.  Endocrine: Negative for polydipsia and polyuria.  Genitourinary: Negative for dysuria and hematuria.  Musculoskeletal: Negative for arthralgias and back pain.  Skin: Negative for color change and rash.  Neurological: Negative for dizziness, tremors, seizures, syncope, facial asymmetry, speech difficulty, weakness, light-headedness, numbness and  headaches.  Psychiatric/Behavioral: Positive for confusion (depressed).  All other systems reviewed and are negative.    Physical Exam Updated Vital Signs  ED Triage Vitals  Enc Vitals Group     BP 03/02/18 0919 123/84     Pulse Rate 03/02/18 0919 91     Resp 03/02/18 0919 18     Temp 03/02/18 0919 98.4 F (36.9 C)     Temp Source 03/02/18 0919 Oral     SpO2 03/02/18 0919 100 %     Weight 03/02/18 0919 220 lb (99.8 kg)     Height --      Head Circumference --      Peak Flow --      Pain Score 03/02/18 0917 0     Pain Loc --      Pain Edu? --      Excl. in [place]? --     Physical Exam  Constitutional: She is oriented to person, place, and time. She appears well-developed and well-nourished. No distress.  HENT:  Head: Normocephalic and atraumatic.  Mouth/Throat: No oropharyngeal exudate.  Eyes: Pupils are equal, round, and reactive to light. Conjunctivae and EOM are normal.  Neck: Normal range of motion. Neck supple.  Cardiovascular: Normal rate, regular rhythm, normal heart sounds and intact distal pulses.  No murmur heard. Pulmonary/Chest: Effort normal and breath sounds normal. No respiratory distress.  Abdominal: Soft. There is no tenderness.  Musculoskeletal: Normal range of motion. She exhibits no edema.  Neurological: She is alert and oriented to person, place, and time. No cranial nerve deficit. She exhibits normal muscle tone. Coordination normal.  Decreased sensation in both feet (baseline), normal sensation otherwise, 5+/5 strength, no drift, normal finger to nose finger  Skin: Skin is warm and dry. Capillary refill takes less than 2 seconds.  Psychiatric: She exhibits a depressed mood. She expresses no homicidal and no suicidal ideation. She expresses no suicidal plans and no homicidal plans.  Nursing note and vitals reviewed.    ED Treatments / Results  Labs (all labs ordered are listed, but only abnormal results are displayed) Labs Reviewed  CBC WITH  DIFFERENTIAL/PLATELET - Abnormal; Notable for the following components:      Result Value   MCH 25.5 (*)    All other components within normal limits  BASIC METABOLIC PANEL - Abnormal; Notable for the following components:   Glucose, Bld 225 (*)    BUN 35 (*)    Creatinine, Ser 1.34 (*)    GFR calc non Af Amer 46 (*)    GFR calc Af Amer 53 (*)    All other components within normal limits  URINALYSIS, ROUTINE W REFLEX MICROSCOPIC - Abnormal; Notable for the following components:   Glucose, UA >=500 (*)    Protein, ur 30 (*)    All other components within normal limits  URINALYSIS, MICROSCOPIC (REFLEX) - Abnormal; Notable for the following components:   Bacteria, UA MANY (*)    All other components within normal limits  CBG MONITORING, ED -  Abnormal; Notable for the following components:   Glucose-Capillary 204 (*)    All other components within normal limits    EKG None  Radiology Ct Head Wo Contrast  Result Date: 03/02/2018 CLINICAL DATA:  52 year old diabetic hypertensive female with lupus presenting with dizziness starting yesterday. History of elevated blood sugars. Initial encounter. EXAM: CT HEAD WITHOUT CONTRAST TECHNIQUE: Contiguous axial images were obtained from the base of the skull through the vertex without intravenous contrast. COMPARISON:  07/12/2011 neck CT. FINDINGS: Brain: No intracranial hemorrhage or CT evidence of large acute infarct. No intracranial mass lesion noted on this unenhanced exam. Vascular: Vascular calcifications.  No acute hyperdense vessel. Skull: Hyperostosis frontalis interna incidentally noted. Sinuses/Orbits: No acute orbital abnormality noted. Mild exophthalmos. Visualized paranasal sinuses are clear. Other: Visualized mastoid air cells and middle ear cavities are clear. IMPRESSION: No intracranial hemorrhage or CT evidence of large acute infarct. Electronically Signed   By: Genia Del M.D.   On: 03/02/2018 09:57    Procedures Procedures  (including critical care time)  Medications Ordered in ED Medications  sodium chloride 0.9 % bolus 1,000 mL ( Intravenous Stopped 03/02/18 1109)     Initial Impression / Assessment and Plan / ED Course  I have reviewed the triage vital signs and the nursing notes.  Pertinent labs & imaging results that were available during my care of the patient were reviewed by me and considered in my medical decision making (see chart for details).     Gabrielle Gill is a 52 year old female with history of diabetes who presents to the ED with high blood sugar, confusion.  Patient with normal vitals.  No fever.  Patient states that her blood sugars greater than 600 this morning.  Took her insulin.  Repeat blood sugar here has improved in the 200s.  Family concerned about some confusion that she is displayed this morning however patient overall well-appearing.  She is very tearful upon my evaluation.  When I talked with the patient alone she states that she is depressed but not suicidal.  Her husband died this past year and she is depressed about the holidays.  She exhibits no confusion and suspect that this was likely secondary to family being around.  Overall she has a normal neurological exam.  No chest pain, no shortness of breath.  Lab work did not show signs of diabetic ketoacidosis.  Head CT was unremarkable.  Doubt any stroke process at this time.  Patient felt better after IV fluids.  EKG shows sinus rhythm.  No signs of ischemic changes.  Patient without any chest pain doubt cardiac process.  When I reevaluated the patient she was at her baseline and overall improved.  Patient also without any signs of urinary tract infection.   This chart was dictated using voice recognition software.  Despite best efforts to proofread,  errors can occur which can change the documentation meaning.  Final Clinical Impressions(s) / ED Diagnoses   Final diagnoses:  Hyperglycemia    ED Discharge Orders    None        Lennice Sites, DO 03/02/18 1336

## 2018-03-02 NOTE — ED Notes (Signed)
ED Provider at bedside. 

## 2018-03-02 NOTE — ED Notes (Signed)
ED Provider at bedside discussing test results and plan of care for dispo.

## 2018-03-02 NOTE — ED Notes (Signed)
Patient transported to CT 

## 2020-05-12 DIAGNOSIS — C541 Malignant neoplasm of endometrium: Secondary | ICD-10-CM | POA: Diagnosis present

## 2020-05-25 DIAGNOSIS — E113411 Type 2 diabetes mellitus with severe nonproliferative diabetic retinopathy with macular edema, right eye: Secondary | ICD-10-CM | POA: Diagnosis present

## 2020-08-09 DIAGNOSIS — D6481 Anemia due to antineoplastic chemotherapy: Secondary | ICD-10-CM | POA: Diagnosis present

## 2020-08-09 DIAGNOSIS — T451X5A Adverse effect of antineoplastic and immunosuppressive drugs, initial encounter: Secondary | ICD-10-CM | POA: Diagnosis present

## 2020-10-23 DIAGNOSIS — N184 Chronic kidney disease, stage 4 (severe): Secondary | ICD-10-CM | POA: Diagnosis present

## 2020-11-11 ENCOUNTER — Emergency Department (HOSPITAL_COMMUNITY): Payer: Medicaid Other

## 2020-11-11 ENCOUNTER — Encounter (HOSPITAL_COMMUNITY): Payer: Self-pay

## 2020-11-11 ENCOUNTER — Other Ambulatory Visit: Payer: Self-pay

## 2020-11-11 ENCOUNTER — Encounter: Payer: Self-pay | Admitting: Emergency Medicine

## 2020-11-11 ENCOUNTER — Observation Stay (HOSPITAL_COMMUNITY)
Admission: EM | Admit: 2020-11-11 | Discharge: 2020-11-13 | Disposition: A | Discharge status: Home / Self Care | Payer: Medicaid Other | Attending: Internal Medicine | Admitting: Internal Medicine

## 2020-11-11 DIAGNOSIS — D649 Anemia, unspecified: Principal | ICD-10-CM | POA: Diagnosis present

## 2020-11-11 DIAGNOSIS — Z79899 Other long term (current) drug therapy: Secondary | ICD-10-CM | POA: Diagnosis not present

## 2020-11-11 DIAGNOSIS — Z20822 Contact with and (suspected) exposure to covid-19: Secondary | ICD-10-CM | POA: Insufficient documentation

## 2020-11-11 DIAGNOSIS — C569 Malignant neoplasm of unspecified ovary: Secondary | ICD-10-CM | POA: Diagnosis not present

## 2020-11-11 DIAGNOSIS — I129 Hypertensive chronic kidney disease with stage 1 through stage 4 chronic kidney disease, or unspecified chronic kidney disease: Secondary | ICD-10-CM | POA: Insufficient documentation

## 2020-11-11 DIAGNOSIS — D631 Anemia in chronic kidney disease: Secondary | ICD-10-CM | POA: Insufficient documentation

## 2020-11-11 DIAGNOSIS — E1165 Type 2 diabetes mellitus with hyperglycemia: Secondary | ICD-10-CM | POA: Insufficient documentation

## 2020-11-11 DIAGNOSIS — N184 Chronic kidney disease, stage 4 (severe): Secondary | ICD-10-CM | POA: Diagnosis not present

## 2020-11-11 DIAGNOSIS — Z794 Long term (current) use of insulin: Secondary | ICD-10-CM | POA: Diagnosis not present

## 2020-11-11 DIAGNOSIS — E119 Type 2 diabetes mellitus without complications: Secondary | ICD-10-CM

## 2020-11-11 DIAGNOSIS — E876 Hypokalemia: Secondary | ICD-10-CM | POA: Insufficient documentation

## 2020-11-11 DIAGNOSIS — Z7901 Long term (current) use of anticoagulants: Secondary | ICD-10-CM | POA: Insufficient documentation

## 2020-11-11 DIAGNOSIS — I10 Essential (primary) hypertension: Secondary | ICD-10-CM | POA: Diagnosis present

## 2020-11-11 DIAGNOSIS — E785 Hyperlipidemia, unspecified: Secondary | ICD-10-CM | POA: Diagnosis present

## 2020-11-11 DIAGNOSIS — C541 Malignant neoplasm of endometrium: Secondary | ICD-10-CM | POA: Diagnosis present

## 2020-11-11 DIAGNOSIS — D6481 Anemia due to antineoplastic chemotherapy: Secondary | ICD-10-CM | POA: Diagnosis not present

## 2020-11-11 DIAGNOSIS — E1122 Type 2 diabetes mellitus with diabetic chronic kidney disease: Secondary | ICD-10-CM | POA: Insufficient documentation

## 2020-11-11 DIAGNOSIS — E1142 Type 2 diabetes mellitus with diabetic polyneuropathy: Secondary | ICD-10-CM | POA: Diagnosis present

## 2020-11-11 DIAGNOSIS — E11319 Type 2 diabetes mellitus with unspecified diabetic retinopathy without macular edema: Secondary | ICD-10-CM

## 2020-11-11 DIAGNOSIS — T451X5A Adverse effect of antineoplastic and immunosuppressive drugs, initial encounter: Secondary | ICD-10-CM | POA: Diagnosis present

## 2020-11-11 DIAGNOSIS — R519 Headache, unspecified: Secondary | ICD-10-CM | POA: Diagnosis present

## 2020-11-11 DIAGNOSIS — E113411 Type 2 diabetes mellitus with severe nonproliferative diabetic retinopathy with macular edema, right eye: Secondary | ICD-10-CM

## 2020-11-11 HISTORY — DX: Malignant neoplasm of unspecified ovary: C56.9

## 2020-11-11 LAB — CBC
HCT: 21 % — ABNORMAL LOW (ref 36.0–46.0)
Hemoglobin: 6.6 g/dL — CL (ref 12.0–15.0)
MCH: 27.6 pg (ref 26.0–34.0)
MCHC: 31.4 g/dL (ref 30.0–36.0)
MCV: 87.9 fL (ref 80.0–100.0)
Platelets: 293 10*3/uL (ref 150–400)
RBC: 2.39 MIL/uL — ABNORMAL LOW (ref 3.87–5.11)
RDW: 15.4 % (ref 11.5–15.5)
WBC: 7 10*3/uL (ref 4.0–10.5)
nRBC: 0 % (ref 0.0–0.2)

## 2020-11-11 LAB — BASIC METABOLIC PANEL
Anion gap: 7 (ref 5–15)
BUN: 31 mg/dL — ABNORMAL HIGH (ref 6–20)
CO2: 26 mmol/L (ref 22–32)
Calcium: 8 mg/dL — ABNORMAL LOW (ref 8.9–10.3)
Chloride: 108 mmol/L (ref 98–111)
Creatinine, Ser: 2.27 mg/dL — ABNORMAL HIGH (ref 0.44–1.00)
GFR, Estimated: 25 mL/min — ABNORMAL LOW (ref >60)
Glucose, Bld: 138 mg/dL — ABNORMAL HIGH (ref 70–99)
Potassium: 3.2 mmol/L — ABNORMAL LOW (ref 3.5–5.1)
Sodium: 141 mmol/L (ref 135–145)

## 2020-11-11 LAB — CBG MONITORING, ED: Glucose-Capillary: 161 mg/dL — ABNORMAL HIGH (ref 70–99)

## 2020-11-11 LAB — PREPARE RBC (CROSSMATCH)

## 2020-11-11 LAB — ABO/RH: ABO/RH(D): B POS

## 2020-11-11 LAB — HEMOGLOBIN AND HEMATOCRIT, BLOOD
HCT: 19.9 % — ABNORMAL LOW (ref 36.0–46.0)
Hemoglobin: 6.3 g/dL — CL (ref 12.0–15.0)

## 2020-11-11 MED ORDER — PREDNISOLONE ACETATE 1 % OP SUSP
1.0000 [drp] | Freq: Four times a day (QID) | OPHTHALMIC | Status: DC
  Administered 2020-11-12 – 2020-11-13 (×5): 1 [drp] via OPHTHALMIC
  Filled 2020-11-11: qty 5

## 2020-11-11 MED ORDER — ATORVASTATIN CALCIUM 80 MG PO TABS
80.0000 mg | ORAL_TABLET | Freq: Every day | ORAL | Status: DC
  Administered 2020-11-12 – 2020-11-13 (×2): 80 mg via ORAL
  Filled 2020-11-11: qty 1
  Filled 2020-11-11: qty 2

## 2020-11-11 MED ORDER — APIXABAN 5 MG PO TABS: 5.0000 mg | ORAL_TABLET | Freq: Two times a day (BID) | ORAL | Status: DC

## 2020-11-11 MED ORDER — MYCOPHENOLATE MOFETIL 500 MG PO TABS: 1500.0000 mg | ORAL_TABLET | Freq: Two times a day (BID) | ORAL | Status: DC

## 2020-11-11 MED ORDER — ACETAMINOPHEN 325 MG PO TABS: 650.0000 mg | ORAL_TABLET | Freq: Four times a day (QID) | ORAL | Status: DC | PRN

## 2020-11-11 MED ORDER — SODIUM CHLORIDE 0.9% FLUSH
3.0000 mL | Freq: Two times a day (BID) | INTRAVENOUS | Status: DC
  Administered 2020-11-11 – 2020-11-13 (×4): 3 mL via INTRAVENOUS

## 2020-11-11 MED ORDER — PREGABALIN 75 MG PO CAPS
75.0000 mg | ORAL_CAPSULE | Freq: Two times a day (BID) | ORAL | Status: DC
  Administered 2020-11-11 – 2020-11-13 (×4): 75 mg via ORAL
  Filled 2020-11-11: qty 1
  Filled 2020-11-11: qty 3
  Filled 2020-11-11: qty 1
  Filled 2020-11-11: qty 3

## 2020-11-11 MED ORDER — INSULIN GLARGINE-YFGN 100 UNIT/ML ~~LOC~~ SOLN
10.0000 [IU] | Freq: Every day | SUBCUTANEOUS | Status: DC
  Administered 2020-11-11 – 2020-11-12 (×2): 10 [IU] via SUBCUTANEOUS
  Filled 2020-11-11 (×3): qty 0.1

## 2020-11-11 MED ORDER — SODIUM CHLORIDE 0.9 % IV SOLN
10.0000 mL/h | Freq: Once | INTRAVENOUS | Status: AC
  Administered 2020-11-11: 10 mL/h via INTRAVENOUS

## 2020-11-11 MED ORDER — MORPHINE SULFATE (PF) 4 MG/ML IV SOLN
4.0000 mg | Freq: Once | INTRAVENOUS | Status: AC
  Administered 2020-11-11: 4 mg via INTRAVENOUS
  Filled 2020-11-11: qty 1

## 2020-11-11 MED ORDER — INSULIN ASPART 100 UNIT/ML IJ SOLN
0.0000 [IU] | Freq: Three times a day (TID) | INTRAMUSCULAR | Status: DC
  Administered 2020-11-12: 1 [IU] via SUBCUTANEOUS

## 2020-11-11 MED ORDER — BUMETANIDE 2 MG PO TABS
2.0000 mg | ORAL_TABLET | Freq: Two times a day (BID) | ORAL | Status: DC
  Administered 2020-11-12 – 2020-11-13 (×3): 2 mg via ORAL
  Filled 2020-11-11 (×4): qty 1

## 2020-11-11 MED ORDER — LABETALOL HCL 5 MG/ML IV SOLN
20.0000 mg | Freq: Once | INTRAVENOUS | Status: AC
  Administered 2020-11-11: 20 mg via INTRAVENOUS
  Filled 2020-11-11: qty 4

## 2020-11-11 MED ORDER — CARVEDILOL 25 MG PO TABS
25.0000 mg | ORAL_TABLET | Freq: Two times a day (BID) | ORAL | Status: DC
  Administered 2020-11-11 – 2020-11-13 (×4): 25 mg via ORAL
  Filled 2020-11-11: qty 2
  Filled 2020-11-11 (×2): qty 1
  Filled 2020-11-11: qty 2

## 2020-11-11 MED ORDER — POTASSIUM CHLORIDE CRYS ER 20 MEQ PO TBCR
40.0000 meq | EXTENDED_RELEASE_TABLET | Freq: Once | ORAL | Status: AC
  Administered 2020-11-11: 40 meq via ORAL
  Filled 2020-11-11: qty 2

## 2020-11-11 MED ORDER — AMLODIPINE BESYLATE 5 MG PO TABS
5.0000 mg | ORAL_TABLET | Freq: Every day | ORAL | Status: DC
  Administered 2020-11-12 – 2020-11-13 (×2): 5 mg via ORAL
  Filled 2020-11-11 (×2): qty 1

## 2020-11-11 MED ORDER — FERROUS SULFATE 325 (65 FE) MG PO TABS
325.0000 mg | ORAL_TABLET | Freq: Three times a day (TID) | ORAL | Status: DC
  Administered 2020-11-12 – 2020-11-13 (×5): 325 mg via ORAL
  Filled 2020-11-11 (×5): qty 1

## 2020-11-11 MED ORDER — ACETAMINOPHEN 650 MG RE SUPP: 650.0000 mg | Freq: Four times a day (QID) | RECTAL | Status: DC | PRN

## 2020-11-11 NOTE — ED Notes (Addendum)
POC Occult Blood sample-negative

## 2020-11-11 NOTE — ED Notes (Signed)
Pt given sandwich bag and ice water.  

## 2020-11-11 NOTE — ED Notes (Signed)
Dr. Tomi Bamberger made aware of pt blood pressure increasing

## 2020-11-11 NOTE — ED Notes (Signed)
Port accessed.  4mg  Morphine given for 9/10 headache.  BP still 219/101.  Notified Dr. Tomi Bamberger.  20mg  labetalol given. 193/97.  Pain now 7/10.
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
## 2020-11-12 DIAGNOSIS — D649 Anemia, unspecified: Secondary | ICD-10-CM | POA: Diagnosis not present

## 2020-11-12 LAB — CBC
HCT: 24.6 % — ABNORMAL LOW (ref 36.0–46.0)
HCT: 25 % — ABNORMAL LOW (ref 36.0–46.0)
Hemoglobin: 7.8 g/dL — ABNORMAL LOW (ref 12.0–15.0)
Hemoglobin: 8 g/dL — ABNORMAL LOW (ref 12.0–15.0)
MCH: 28.3 pg (ref 26.0–34.0)
MCH: 28.1 pg (ref 26.0–34.0)
MCHC: 31.7 g/dL (ref 30.0–36.0)
MCHC: 32 g/dL (ref 30.0–36.0)
MCV: 89.1 fL (ref 80.0–100.0)
MCV: 87.7 fL (ref 80.0–100.0)
Platelets: 278 10*3/uL (ref 150–400)
Platelets: 305 10*3/uL (ref 150–400)
RBC: 2.76 MIL/uL — ABNORMAL LOW (ref 3.87–5.11)
RBC: 2.85 MIL/uL — ABNORMAL LOW (ref 3.87–5.11)
RDW: 15 % (ref 11.5–15.5)
RDW: 15.3 % (ref 11.5–15.5)
WBC: 6.1 10*3/uL (ref 4.0–10.5)
WBC: 6.1 10*3/uL (ref 4.0–10.5)
nRBC: 0 % (ref 0.0–0.2)
nRBC: 0 % (ref 0.0–0.2)

## 2020-11-12 LAB — BASIC METABOLIC PANEL
Anion gap: 8 (ref 5–15)
BUN: 32 mg/dL — ABNORMAL HIGH (ref 6–20)
CO2: 25 mmol/L (ref 22–32)
Calcium: 7.9 mg/dL — ABNORMAL LOW (ref 8.9–10.3)
Chloride: 108 mmol/L (ref 98–111)
Creatinine, Ser: 2.23 mg/dL — ABNORMAL HIGH (ref 0.44–1.00)
GFR, Estimated: 26 mL/min — ABNORMAL LOW (ref >60)
Glucose, Bld: 148 mg/dL — ABNORMAL HIGH (ref 70–99)
Potassium: 3.2 mmol/L — ABNORMAL LOW (ref 3.5–5.1)
Sodium: 141 mmol/L (ref 135–145)

## 2020-11-12 LAB — CBG MONITORING, ED
Glucose-Capillary: 111 mg/dL — ABNORMAL HIGH (ref 70–99)
Glucose-Capillary: 111 mg/dL — ABNORMAL HIGH (ref 70–99)

## 2020-11-12 LAB — HEPARIN LEVEL (UNFRACTIONATED): Heparin Unfractionated: 0.86 IU/mL — ABNORMAL HIGH (ref 0.30–0.70)

## 2020-11-12 LAB — CBC WITH DIFFERENTIAL/PLATELET
Abs Immature Granulocytes: 0.02 10*3/uL (ref 0.00–0.07)
Basophils Absolute: 0 10*3/uL (ref 0.0–0.1)
Basophils Relative: 0 %
Eosinophils Absolute: 0.3 10*3/uL (ref 0.0–0.5)
Eosinophils Relative: 5 %
HCT: 29.8 % — ABNORMAL LOW (ref 36.0–46.0)
Hemoglobin: 9.5 g/dL — ABNORMAL LOW (ref 12.0–15.0)
Immature Granulocytes: 0 %
Lymphocytes Relative: 14 %
Lymphs Abs: 0.8 10*3/uL (ref 0.7–4.0)
MCH: 27.9 pg (ref 26.0–34.0)
MCHC: 31.9 g/dL (ref 30.0–36.0)
MCV: 87.4 fL (ref 80.0–100.0)
Monocytes Absolute: 0.6 10*3/uL (ref 0.1–1.0)
Monocytes Relative: 11 %
Neutro Abs: 3.8 10*3/uL (ref 1.7–7.7)
Neutrophils Relative %: 70 %
Platelets: 257 10*3/uL (ref 150–400)
RBC: 3.41 MIL/uL — ABNORMAL LOW (ref 3.87–5.11)
RDW: 15 % (ref 11.5–15.5)
WBC: 5.5 10*3/uL (ref 4.0–10.5)
nRBC: 0 % (ref 0.0–0.2)

## 2020-11-12 LAB — SARS CORONAVIRUS 2 (TAT 6-24 HRS): SARS Coronavirus 2: NEGATIVE

## 2020-11-12 LAB — GLUCOSE, CAPILLARY
Glucose-Capillary: 149 mg/dL — ABNORMAL HIGH (ref 70–99)
Glucose-Capillary: 174 mg/dL — ABNORMAL HIGH (ref 70–99)

## 2020-11-12 LAB — APTT: aPTT: 88 s — ABNORMAL HIGH (ref 24–36)

## 2020-11-12 MED ORDER — HEPARIN (PORCINE) 25000 UT/250ML-% IV SOLN
1450.0000 [IU]/h | INTRAVENOUS | Status: AC
  Administered 2020-11-12 – 2020-11-13 (×2): 1450 [IU]/h via INTRAVENOUS
  Filled 2020-11-12 (×2): qty 250

## 2020-11-12 MED ORDER — CHLORHEXIDINE GLUCONATE CLOTH 2 % EX PADS
6.0000 | MEDICATED_PAD | Freq: Every day | CUTANEOUS | Status: DC
  Administered 2020-11-13: 6 via TOPICAL
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
## 2020-11-12 NOTE — Plan of Care (Signed)

## 2020-11-12 NOTE — ED Notes (Signed)
Pt ambulated self to the bathroom with her cane. RN walked alongside in case of need for assistance.

## 2020-11-12 NOTE — ED Notes (Signed)
Lunch tray ordered 

## 2020-11-12 NOTE — Progress Notes (Signed)
Pt reported she has had several falls lately at home, especially since her last chemo treatment in July. She was just using the walls to help keep from falling. Someone brought her a cane yesterday (it is bedside). She is using the front wheel walker here. She feels more comfortable with the walker.  Part of the falling also has to do with the edema in her legs. She said they are too heavy to lift at times and falls.  Pt is allergic to the CHG wipes. NT wiped down one arm, and she started to break out in a rash. We gave her a soap and water bath instead.

## 2020-11-12 NOTE — ED Notes (Signed)
Pt given sandwich bag and [employer]

## 2020-11-12 NOTE — ED Notes (Signed)
Attempted to call report. RN not available at this time. Extension given to Web designer for call back.
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
## 2020-11-12 NOTE — ED Notes (Signed)
Attempted to call report; charge RN to assign pt to a nurse. Extension given for call back.

## 2020-11-13 DIAGNOSIS — D649 Anemia, unspecified: Secondary | ICD-10-CM | POA: Diagnosis not present

## 2020-11-13 LAB — APTT: aPTT: 106 seconds — ABNORMAL HIGH (ref 24–36)

## 2020-11-13 LAB — CBC
HCT: 25.1 % — ABNORMAL LOW (ref 36.0–46.0)
Hemoglobin: 8.3 g/dL — ABNORMAL LOW (ref 12.0–15.0)
MCH: 28.8 pg (ref 26.0–34.0)
MCHC: 33.1 g/dL (ref 30.0–36.0)
MCV: 87.2 fL (ref 80.0–100.0)
Platelets: 301 10*3/uL (ref 150–400)
RBC: 2.88 MIL/uL — ABNORMAL LOW (ref 3.87–5.11)
RDW: 15.3 % (ref 11.5–15.5)
WBC: 6 10*3/uL (ref 4.0–10.5)
nRBC: 0 % (ref 0.0–0.2)

## 2020-11-13 LAB — TYPE AND SCREEN
ABO/RH(D): B POS
Antibody Screen: NEGATIVE
Unit division: 0
Unit division: 0

## 2020-11-13 LAB — BPAM RBC
Blood Product Expiration Date: 202209012359
Blood Product Expiration Date: 202209012359
ISSUE DATE / TIME: 202208132054
ISSUE DATE / TIME: 202208140002
Unit Type and Rh: 7300
Unit Type and Rh: 7300

## 2020-11-13 LAB — HEPARIN LEVEL (UNFRACTIONATED): Heparin Unfractionated: 0.66 IU/mL (ref 0.30–0.70)

## 2020-11-13 LAB — GLUCOSE, CAPILLARY
Glucose-Capillary: 104 mg/dL — ABNORMAL HIGH (ref 70–99)
Glucose-Capillary: 119 mg/dL — ABNORMAL HIGH (ref 70–99)

## 2020-11-13 LAB — OCCULT BLOOD, POC DEVICE: Fecal Occult Bld: NEGATIVE

## 2020-11-13 MED ORDER — HEPARIN SOD (PORK) LOCK FLUSH 100 UNIT/ML IV SOLN
500.0000 [IU] | INTRAVENOUS | Status: AC | PRN
  Administered 2020-11-13: 500 [IU]
  Filled 2020-11-13: qty 5

## 2020-11-13 MED ORDER — SODIUM CHLORIDE 0.9% FLUSH
10.0000 mL | Freq: Two times a day (BID) | INTRAVENOUS | Status: DC
  Administered 2020-11-13: 10 mL

## 2020-11-13 MED ORDER — APIXABAN 5 MG PO TABS
5.0000 mg | ORAL_TABLET | Freq: Two times a day (BID) | ORAL | Status: DC
  Administered 2020-11-13: 5 mg via ORAL
  Filled 2020-11-13: qty 1

## 2020-11-13 NOTE — Plan of Care (Signed)
  Problem: Education: Goal: Knowledge of General Education information will improve Description: Including pain rating scale, medication(s)/side effects and non-pharmacologic comfort measures 11/13/2020 0050 by Braulio Bosch, RN Outcome: Progressing 11/12/2020 2344 by Braulio Bosch, RN Outcome: Progressing   Problem: Health Behavior/Discharge Planning: Goal: Ability to manage health-related needs will improve 11/13/2020 0050 by Braulio Bosch, RN Outcome: Progressing 11/12/2020 2344 by Braulio Bosch, RN Outcome: Progressing   Problem: Clinical Measurements: Goal: Ability to maintain clinical measurements within normal limits will improve 11/13/2020 0050 by Braulio Bosch, RN Outcome: Progressing 11/12/2020 2344 by Anne Fu D, RN Outcome: Progressing Goal: Will remain free from infection 11/13/2020 0050 by Braulio Bosch, RN Outcome: Progressing 11/12/2020 2344 by Braulio Bosch, RN Outcome: Progressing Goal: Diagnostic test results will improve 11/13/2020 0050 by Braulio Bosch, RN Outcome: Progressing 11/12/2020 2344 by Anne Fu D, RN Outcome: Progressing Goal: Respiratory complications will improve 11/13/2020 0050 by Braulio Bosch, RN Outcome: Progressing 11/12/2020 2344 by Anne Fu D, RN Outcome: Progressing Goal: Cardiovascular complication will be avoided 11/13/2020 0050 by Braulio Bosch, RN Outcome: Progressing 11/12/2020 2344 by Braulio Bosch, RN Outcome: Progressing

## 2020-11-13 NOTE — Progress Notes (Signed)
Patient discharged to home with instructions and verbalized understanding. 

## 2020-11-13 NOTE — TOC CAGE-AID Note (Signed)
Transition of Care [facility]) - CAGE-AID Screening   Patient Details  Name: Gabrielle Gill MRN: [id number] Date of Birth: September 18, 1965  Transition of Care [facility]) CM/SW Contact:    [name], [name] Phone Number: 11/13/2020, 2:53 PM   Clinical Narrative: Pt participated in [facility].  Pt stated she does not use substance or ETOH.  Pt was not offered resources, due to no usage of substance or ETOH.    [name], MSW, LCSW-A Pronouns:  She/Her/Hers [facility] Clinical Social Worker Direct Number:  [phone] [name]   CAGE-AID Screening:    Have You Ever Felt You Ought to Cut Down on Your Drinking or Drug Use?: No Have People Annoyed You By [employer] Your Drinking Or Drug Use?: No Have You Felt Bad Or Guilty About Your Drinking Or Drug Use?: No Have You Ever Had a Drink or Used Drugs First Thing In The Morning to Steady Your Nerves or to Get Rid of a Hangover?: No CAGE-AID Score: 0  Substance Abuse Education Offered: No

## 2020-11-13 NOTE — Progress Notes (Signed)
[place] for heparin > apixaban Indication: DVT  Allergies  Allergen Reactions   Tape Other (See Comments)    Adhesive Tape- unlnown    Patient Measurements: Height: 5\' 8"  (172.7 cm) Weight: 123.8 kg (273 lb) IBW/kg (Calculated) : 63.9 Heparin Dosing Weight: 93 kg  Vital Signs: Temp: 98.5 F (36.9 C) (08/15 0742) Temp Source: Oral (08/15 0742) BP: 173/86 (08/15 0742) Pulse Rate: 87 (08/15 0742)  Labs: Recent Labs    11/11/20 1630 11/11/20 1940 11/12/20 0510 11/12/20 0922 11/12/20 1520 11/12/20 1806 11/13/20 1036  HGB 6.6*   < > 7.8* 9.5* 8.0*  --  8.3*  HCT 21.0*   < > 24.6* 29.8* 25.0*  --  25.1*  PLT 293  --  278 257 305  --  301  APTT  --   --   --   --   --  88*  --   HEPARINUNFRC  --   --   --   --   --  0.86*  --   CREATININE 2.27*  --  2.23*  --   --   --   --    < > = values in this interval not displayed.     Estimated Creatinine Clearance: 40 mL/min (A) (by C-G formula based on SCr of 2.23 mg/dL (H)).  Assessment:  55 yo f presenting with hx of dvt in July 2022 on apixaban pta (last dose unknown). Pharmacy consulted to dose heparin due to low hgb on admission. Pharmacy now consulted to resume prior to admission apixaban. Hgb 8.3. No bleeding noted per RN.   Goal of Therapy:  Heparin level 0.3-0.7 units/ml aPTT 66 - 102 seconds Monitor platelets by anticoagulation protocol: Yes   Plan:  Stop heparin and resume apixaban 5mg  twice daily - turn off heparin when first dose of apixaban given (RN aware)  Cristela Felt, PharmD, BCPS Clinical Pharmacist 11/13/2020 11:42 AM
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
